# Patient Record
Sex: Male | Born: 1950 | Race: Black or African American | Hispanic: No | Marital: Married | Smoking: Current every day smoker
Health system: Southern US, Community
[De-identification: ages and names within clinical notes are randomized; demographics above are authoritative.]

## PROBLEM LIST (undated history)

## (undated) DIAGNOSIS — C159 Malignant neoplasm of esophagus, unspecified: Secondary | ICD-10-CM

## (undated) DIAGNOSIS — K219 Gastro-esophageal reflux disease without esophagitis: Secondary | ICD-10-CM

## (undated) DIAGNOSIS — E119 Type 2 diabetes mellitus without complications: Secondary | ICD-10-CM

## (undated) DIAGNOSIS — I10 Essential (primary) hypertension: Secondary | ICD-10-CM

## (undated) DIAGNOSIS — N400 Enlarged prostate without lower urinary tract symptoms: Secondary | ICD-10-CM

## (undated) DIAGNOSIS — E039 Hypothyroidism, unspecified: Secondary | ICD-10-CM

## (undated) DIAGNOSIS — C169 Malignant neoplasm of stomach, unspecified: Secondary | ICD-10-CM

## (undated) DIAGNOSIS — K635 Polyp of colon: Secondary | ICD-10-CM

## (undated) DIAGNOSIS — F17200 Nicotine dependence, unspecified, uncomplicated: Secondary | ICD-10-CM

## (undated) DIAGNOSIS — E785 Hyperlipidemia, unspecified: Secondary | ICD-10-CM

## (undated) DIAGNOSIS — B353 Tinea pedis: Secondary | ICD-10-CM

## (undated) HISTORY — DX: Malignant neoplasm of esophagus, unspecified: C15.9

## (undated) HISTORY — DX: Essential (primary) hypertension: I10

## (undated) HISTORY — DX: Hyperlipidemia, unspecified: E78.5

## (undated) HISTORY — DX: Type 2 diabetes mellitus without complications: E11.9

## (undated) HISTORY — DX: Tinea pedis: B35.3

## (undated) HISTORY — PX: TONSILLECTOMY: SUR1361

## (undated) HISTORY — DX: Gastro-esophageal reflux disease without esophagitis: K21.9

## (undated) HISTORY — PX: UPPER GI ENDOSCOPY: SHX6162

## (undated) HISTORY — DX: Nicotine dependence, unspecified, uncomplicated: F17.200

## (undated) HISTORY — PX: COLONOSCOPY: SHX174

## (undated) HISTORY — DX: Hypothyroidism, unspecified: E03.9

## (undated) HISTORY — DX: Polyp of colon: K63.5

## (undated) HISTORY — DX: Benign prostatic hyperplasia without lower urinary tract symptoms: N40.0

---

## 2004-03-13 DIAGNOSIS — M51379 Other intervertebral disc degeneration, lumbosacral region without mention of lumbar back pain or lower extremity pain: Secondary | ICD-10-CM | POA: Insufficient documentation

## 2004-03-13 DIAGNOSIS — M5137 Other intervertebral disc degeneration, lumbosacral region: Secondary | ICD-10-CM | POA: Insufficient documentation

## 2017-06-13 DIAGNOSIS — C159 Malignant neoplasm of esophagus, unspecified: Secondary | ICD-10-CM | POA: Insufficient documentation

## 2017-06-13 HISTORY — DX: Malignant neoplasm of esophagus, unspecified: C15.9

## 2017-07-05 DIAGNOSIS — C169 Malignant neoplasm of stomach, unspecified: Secondary | ICD-10-CM | POA: Insufficient documentation

## 2017-11-27 ENCOUNTER — Encounter: Payer: Self-pay | Admitting: Cardiothoracic Surgery

## 2017-11-28 ENCOUNTER — Encounter: Payer: Self-pay | Admitting: *Deleted

## 2017-11-28 ENCOUNTER — Encounter: Payer: Self-pay | Admitting: Cardiothoracic Surgery

## 2017-11-28 ENCOUNTER — Other Ambulatory Visit: Payer: Self-pay

## 2017-11-28 NOTE — Progress Notes (Signed)
Patient no show for appointment 11/28/17, called patient to r/s, patient stated  That he would call after his appointment with [facility] on 7/24. If he needed to r/s

## 2017-11-28 NOTE — Progress Notes (Deleted)
Nathan Massey       Nathan Massey             [phone]                    Nathan Massey [place] Medical Record #[id number] Date of Birth: [birth year]  Referring: [place] Primary [facility]: No primary care provider on file. Primary Cardiologist: No primary care provider on file.  Chief Complaint:   No chief complaint on file.   History of Present Illness:    Nathan Massey 67 y.o. male is seen in the office  today for       Current Activity/ Functional Status:  {functional status:19517}   Zubrod Score: At the time of surgery this patient's most appropriate activity status/level should be described as: []     0    Normal activity, no symptoms []     1    Restricted in physical strenuous activity but ambulatory, able to do out light work []     2    Ambulatory and capable of self care, unable to do work activities, up and about               >50 % of waking hours                              []     3    Only limited self care, in bed greater than 50% of waking hours []     4    Completely disabled, no self care, confined to bed or chair []     5    Moribund   Past Medical History:  Diagnosis Date  . BPH (benign prostatic hyperplasia)       . Colonic polyp   . Diabetes mellitus ([place])   . 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 GERD (gastroesophageal reflux disease)   . Hyperlipemia   . Hypertension   . Hypothyroidism   . Nicotine dependence   . Tinea pedis     *** The histories are not reviewed yet. Please review them in the "History" navigator section and refresh this [place].  Family History  Problem Relation Age of Onset  . Throat cancer Father      Social History   Tobacco Use  Smoking Status Current Every Day Smoker    Social History   Substance and Sexual Activity  Alcohol Use Yes     Allergies not on file  No current outpatient medications on file.   No current facility-administered medications for this visit.      {Ros - complete:30496}   Review of Systems:     Cardiac Review of Systems: [Y] = yes  or   [ N ] = no   Chest Pain [    ]  Resting SOB [   ] Exertional SOB  [  ]  Orthopnea [  ]   Pedal Edema [   ]    Palpitations [  ] Syncope  [  ]   Presyncope [   ]   General Review of Systems: [Y] = yes [  ]=no Constitional: recent weight change [  ];  Wt loss over the last 3 months [   ] anorexia [  ]; fatigue [  ]; nausea [  ]; night sweats [  ]; fever [  ]; or chills [  ];  Eye : blurred vision [  ]; diplopia [   ]; vision changes [  ];  Amaurosis fugax[  ]; Resp: cough [  ];  wheezing[  ];  hemoptysis[  ]; shortness of breath[  ]; paroxysmal nocturnal dyspnea[  ]; dyspnea on exertion[  ]; or orthopnea[  ];  GI:  gallstones[  ], vomiting[  ];  dysphagia[  ]; melena[  ];  hematochezia [  ]; heartburn[  ];   Hx of  Colonoscopy[  ]; GU: kidney stones [  ]; hematuria[  ];   dysuria [  ];  nocturia[  ];  history of     obstruction [  ]; urinary frequency [  ]             Skin: rash, swelling[  ];, hair loss[  ];  peripheral edema[  ];  or itching[  ]; Musculosketetal: myalgias[  ];  joint swelling[  ];  joint erythema[  ];  joint pain[  ];  back pain[  ];  Heme/Lymph: bruising[  ];  bleeding[  ];  anemia[  ];  Neuro: TIA[  ];  headaches[  ];  stroke[  ];  vertigo[  ];  seizures[  ];   paresthesias[  ];  difficulty walking[  ];  Psych:depression[  ]; anxiety[  ];  Endocrine: diabetes[  ];  thyroid dysfunction[  ];  Immunizations: Flu up to date [  ]; Pneumococcal up to date [  ];  Other:    PHYSICAL EXAMINATION: There were no vitals taken for this visit. {physical exam:21449}  Diagnostic Studies & Laboratory data:     Recent Radiology Findings:   No results found.   I have independently reviewed the above radiology studies  and reviewed the findings with the patient.   Recent Lab Findings: No results found for: WBC, HGB, HCT, PLT, GLUCOSE, CHOL, TRIG, HDL, LDLDIRECT, LDLCALC, ALT,  AST, NA, K, CL, CREATININE, BUN, CO2, TSH, INR, GLUF, HGBA1C    Assessment / Plan:        I  spent {CHL ONC TIME VISIT - IOXBD:5329924268} with  the patient face to face and greater then 50% of the time was spent in counseling and coordination of care.    Grace Isaac MD      [address] [place] [address] Office [phone]   Beeper [phone]  11/28/2017 9:35 AM

## 2018-01-08 ENCOUNTER — Encounter: Payer: Self-pay | Admitting: Cardiothoracic Surgery

## 2018-01-08 NOTE — Progress Notes (Deleted)
[address]       [place] [address]             [phone]                    Akhil Lee Ordaz [place] Medical Record #[id number] Date of Birth: Sep 18, 1950  Referring: [place] Primary Care: No primary care provider on file. Primary Cardiologist: No primary care provider on file.  Chief Complaint:   No chief complaint on file.   History of Present Illness:    Nathan Massey 67 y.o. male is seen in the office  today for       Current Activity/ Functional Status:  {functional status:19517}   Zubrod Score: At the time of surgery this patient's most appropriate activity status/level should be described as: []     0    Normal activity, no symptoms []     1    Restricted in physical strenuous activity but ambulatory, able to do out light work []     2    Ambulatory and capable of self care, unable to do work activities, up and about               >50 % of waking hours                              []     3    Only limited self care, in bed greater than 50% of waking hours []     4    Completely disabled, no self care, confined to bed or chair []     5    Moribund   Past Medical History:  Diagnosis Date  . BPH (benign prostatic hyperplasia)       . Colonic polyp   . Diabetes mellitus ([place])   . 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 GERD (gastroesophageal reflux disease)   . Hyperlipemia   . Hypertension   . Hypothyroidism   . Nicotine dependence   . Tinea pedis     *** The histories are not reviewed yet. Please review them in the "History" navigator section and refresh this [place].  Family History  Problem Relation Age of Onset  . Throat cancer Father      Social History   Tobacco Use  Smoking Status Current Every Day Smoker    Social History   Substance and Sexual Activity  Alcohol Use Yes     Allergies no known allergies  Current Outpatient Medications  Medication Sig Dispense Refill  . oxyCODONE-acetaminophen  (PERCOCET/ROXICET) 5-325 MG tablet Take 1 tablet by mouth every 4 (four) hours as needed. for pain  0  . prochlorperazine (COMPAZINE) 10 MG tablet   1   No current facility-administered medications for this visit.     {Ros - complete:30496}   Review of Systems:     Cardiac Review of Systems: [Y] = yes  or   [ N ] = no   Chest Pain [    ]  Resting SOB [   ] Exertional SOB  [  ]  Orthopnea [  ]   Pedal Edema [   ]    Palpitations [  ] Syncope  [  ]   Presyncope [   ]   General Review of Systems: [Y] = yes [  ]=no Constitional: recent weight change [  ];  Wt loss over the  last 3 months [   ] anorexia [  ]; fatigue [  ]; nausea [  ]; night sweats [  ]; fever [  ]; or chills [  ];           Eye : blurred vision [  ]; diplopia [   ]; vision changes [  ];  Amaurosis fugax[  ]; Resp: cough [  ];  wheezing[  ];  hemoptysis[  ]; shortness of breath[  ]; paroxysmal nocturnal dyspnea[  ]; dyspnea on exertion[  ]; or orthopnea[  ];  GI:  gallstones[  ], vomiting[  ];  dysphagia[  ]; melena[  ];  hematochezia [  ]; heartburn[  ];   Hx of  Colonoscopy[  ]; GU: kidney stones [  ]; hematuria[  ];   dysuria [  ];  nocturia[  ];  history of     obstruction [  ]; urinary frequency [  ]             Skin: rash, swelling[  ];, hair loss[  ];  peripheral edema[  ];  or itching[  ]; Musculosketetal: myalgias[  ];  joint swelling[  ];  joint erythema[  ];  joint pain[  ];  back pain[  ];  Heme/Lymph: bruising[  ];  bleeding[  ];  anemia[  ];  Neuro: TIA[  ];  headaches[  ];  stroke[  ];  vertigo[  ];  seizures[  ];   paresthesias[  ];  difficulty walking[  ];  Psych:depression[  ]; anxiety[  ];  Endocrine: diabetes[  ];  thyroid dysfunction[  ];  Immunizations: Flu up to date [  ]; Pneumococcal up to date [  ];  Other: {Dental Care:21289::"Single injection performed as below:"} {Flue/Pneumonia Vaccination Status:21291}     PHYSICAL EXAMINATION: There were no vitals taken for this visit. {physical  exam:21449}  Diagnostic Studies & Laboratory data:     Recent Radiology Findings:   No results found.   I have independently reviewed the above radiology studies  and reviewed the findings with the patient.   Recent Lab Findings: No results found for: WBC, HGB, HCT, PLT, GLUCOSE, CHOL, TRIG, HDL, LDLDIRECT, LDLCALC, ALT, AST, NA, K, CL, CREATININE, BUN, CO2, TSH, INR, GLUF, HGBA1C    Assessment / Plan:        I  spent {CHL ONC TIME VISIT - PXTGG:[id number]} with  the patient face to face and greater then 50% of the time was spent in counseling and coordination of care.    Grace Isaac MD      [address] [place] [address] Office [phone]   Beeper [phone]  01/08/2018 2:08 PM           [address]       [place] [address]             [phone]                    Aurthur Lee Hazen [place] Medical Record #[id number] Date of Birth: Aug 07, 1950  Referring: [place] Primary Care: No primary care provider on file. Primary Cardiologist: No primary care provider on file.  Chief Complaint:   No chief complaint on file.   History of Present Illness:    Nathan Massey 67 y.o. male is seen in the office  today for       Current Activity/ Functional Status:  {functional status:19517}  Zubrod Score: At the time of surgery this patient's most appropriate activity status/level should be described as: []     0    Normal activity, no symptoms []     1    Restricted in physical strenuous activity but ambulatory, able to do out light work []     2    Ambulatory and capable of self care, unable to do work activities, up and about               >50 % of waking hours                              []     3    Only limited self care, in bed greater than 50% of waking hours []     4    Completely disabled, no self care, confined to bed or chair []     5    Moribund   Past Medical History:  Diagnosis Date  .  BPH (benign prostatic hyperplasia)       . Colonic polyp   . Diabetes mellitus ([place])   . 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 GERD (gastroesophageal reflux disease)   . Hyperlipemia   . Hypertension   . Hypothyroidism   . Nicotine dependence   . Tinea pedis     *** The histories are not reviewed yet. Please review them in the "History" navigator section and refresh this [place].  Family History  Problem Relation Age of Onset  . Throat cancer Father      Social History   Tobacco Use  Smoking Status Current Every Day Smoker    Social History   Substance and Sexual Activity  Alcohol Use Yes     Allergies no known allergies  Current Outpatient Medications  Medication Sig Dispense Refill  . oxyCODONE-acetaminophen (PERCOCET/ROXICET) 5-325 MG tablet Take 1 tablet by mouth every 4 (four) hours as needed. for pain  0  . prochlorperazine (COMPAZINE) 10 MG tablet   1   No current facility-administered medications for this visit.     {Ros - complete:30496}   Review of Systems:     Cardiac Review of Systems: [Y] = yes  or   [ N ] = no   Chest Pain [    ]  Resting SOB [   ] Exertional SOB  [  ]  Orthopnea [  ]   Pedal Edema [   ]    Palpitations [  ] Syncope  [  ]   Presyncope [   ]   General Review of Systems: [Y] = yes [  ]=no Constitional: recent weight change [  ];  Wt loss over the last 3 months [   ] anorexia [  ]; fatigue [  ]; nausea [  ]; night sweats [  ]; fever [  ]; or chills [  ];           Eye : blurred vision [  ]; diplopia [   ]; vision changes [  ];  Amaurosis fugax[  ]; Resp: cough [  ];  wheezing[  ];  hemoptysis[  ]; shortness of breath[  ]; paroxysmal nocturnal dyspnea[  ]; dyspnea on exertion[  ]; or orthopnea[  ];  GI:  gallstones[  ], vomiting[  ];  dysphagia[  ]; melena[  ];  hematochezia [  ]; heartburn[  ];   Hx of  Colonoscopy[  ]; GU:  kidney stones [  ]; hematuria[  ];   dysuria [  ];  nocturia[  ];  history of     obstruction [  ]; urinary frequency [   ]             Skin: rash, swelling[  ];, hair loss[  ];  peripheral edema[  ];  or itching[  ]; Musculosketetal: myalgias[  ];  joint swelling[  ];  joint erythema[  ];  joint pain[  ];  back pain[  ];  Heme/Lymph: bruising[  ];  bleeding[  ];  anemia[  ];  Neuro: TIA[  ];  headaches[  ];  stroke[  ];  vertigo[  ];  seizures[  ];   paresthesias[  ];  difficulty walking[  ];  Psych:depression[  ]; anxiety[  ];  Endocrine: diabetes[  ];  thyroid dysfunction[  ];  Immunizations: Flu up to date [  ]; Pneumococcal up to date [  ];  Other:     PHYSICAL EXAMINATION: There were no vitals taken for this visit. {physical exam:21449}  Diagnostic Studies & Laboratory data:     Recent Radiology Findings:   No results found.   I have independently reviewed the above radiology studies  and reviewed the findings with the patient.   Recent Lab Findings: No results found for: WBC, HGB, HCT, PLT, GLUCOSE, CHOL, TRIG, HDL, LDLDIRECT, LDLCALC, ALT, AST, NA, K, CL, CREATININE, BUN, CO2, TSH, INR, GLUF, HGBA1C    Assessment / Plan:        I  spent {CHL ONC TIME VISIT - OIZTI:[id number]} with  the patient face to face and greater then 50% of the time was spent in counseling and coordination of care.    Grace Isaac MD      [address] ,[place] [address] Office [phone]   Beeper [phone]  01/08/2018 2:08 PM

## 2018-01-09 ENCOUNTER — Telehealth: Payer: Self-pay | Admitting: *Deleted

## 2018-01-30 ENCOUNTER — Encounter: Payer: Self-pay | Admitting: Cardiothoracic Surgery

## 2018-02-12 ENCOUNTER — Encounter: Payer: Self-pay | Admitting: *Deleted

## 2018-02-12 DIAGNOSIS — E119 Type 2 diabetes mellitus without complications: Secondary | ICD-10-CM | POA: Insufficient documentation

## 2018-02-12 DIAGNOSIS — I10 Essential (primary) hypertension: Secondary | ICD-10-CM | POA: Insufficient documentation

## 2018-02-12 DIAGNOSIS — C158 Malignant neoplasm of overlapping sites of esophagus: Secondary | ICD-10-CM

## 2018-02-13 ENCOUNTER — Encounter: Payer: Self-pay | Admitting: Cardiothoracic Surgery

## 2018-02-14 ENCOUNTER — Institutional Professional Consult (permissible substitution) (INDEPENDENT_AMBULATORY_CARE_PROVIDER_SITE_OTHER): Payer: Medicare Other | Admitting: Cardiothoracic Surgery

## 2018-02-14 VITALS — BP 124/86 | HR 58 | Resp 20 | Ht 68.0 in | Wt 120.2 lb

## 2018-02-14 DIAGNOSIS — C160 Malignant neoplasm of cardia: Secondary | ICD-10-CM | POA: Diagnosis not present

## 2018-02-14 NOTE — Progress Notes (Signed)
[address]       [place] [address]             [phone]                    Ajeet Lee Speas [place] Medical Record #[id number] Date of Birth: [birth year]  Referring: [facility] Primary Care: Patient, No Pcp Per Primary Cardiologist: No primary care provider on file.  Chief Complaint:    Chief Complaint  Patient presents with  . Esophageal Cancer    Surgical eval    History of Present Illness:    Nathan Massey 67 y.o. male is seen in the office referred by the [facility] for gastric carcinoma.  The patient originally presented in early 2019 to the [place]. he noted difficulty swallowing.  In addition he had weight loss and intermittent epigastric pain.  A EGD was done at the [facility] June 13, 2017, we have no images of this report notes a large friable mass extending from the GE junction to the fundus of the stomach 42 to 51 cm.  Per the reports from the [place] Mrs. a moderately differentiated adenocarcinoma.  The patient then had a EUS performed in [place] at [facility].  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  The patient started on a series of treatments with weekly carboplatin and Taxol and radiation.  He started radiation therapy August 14, 2017 and completed it  He completed radiation chemotherapy in May.  Has now referred for consideration of resection.  I tried to contact the [facility] system and his oncologist would been caring for him is no longer working there.  Dr. Randell Patient was able to give me some of the information in his care.  The patient now notes that he is gaining some of his weight back initially had lost 20 to 25 pounds.  He is taking a p.o. diet without difficulty.  Denies any abdominal pain.  Denies regurgitation.  The PET scan on the disc was sent with patient has been loaded into the [facility] PACS system, the scan was done in August. -Compared to the scan done in March, there is no hypermetabolic activity in the  stomach area.  Current Activity/ Functional Status:  Patient is independent with mobility/ambulation, transfers, ADL's, IADL's.   Zubrod Score: At the time of surgery this patient's most appropriate activity status/level should be described as: [x]     0    Normal activity, no symptoms []     1    Restricted in physical strenuous activity but ambulatory, able to do out light work []     2    Ambulatory and capable of self care, unable to do work activities, up and about               >50 % of waking hours                              []     3    Only limited self care, in bed greater than 50% of waking hours []     4    Completely disabled, no self care, confined to bed or chair []     5    Moribund   Past Medical History:  Diagnosis Date  . BPH (benign prostatic hyperplasia)       . Colonic polyp   . Diabetes mellitus (  [place])   . 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 GERD (gastroesophageal reflux disease)   . Hyperlipemia   . Hypertension   . Hypothyroidism   . Nicotine dependence   . Tinea pedis     Past Surgical History:  Procedure Laterality Date  . COLONOSCOPY    . TONSILLECTOMY    . UPPER GI ENDOSCOPY     06/13/2017    Family History  Problem Relation Age of Onset  . Throat cancer Father      Social History   Tobacco Use  Smoking Status Current Every Day Smoker  . Packs/day: 1.50  . Years: 50.00  . Pack years: 75.00    Social History   Substance and Sexual Activity  Alcohol Use Yes   Comment: HARD LIQUOR X 50 YRS, NOW BEER     No Known Allergies  Current Outpatient Medications  Medication Sig Dispense Refill  . aspirin EC 81 MG tablet Take 81 mg by mouth daily.    Marland Kitchen atorvastatin (LIPITOR) 10 MG tablet Take 10 mg by mouth at bedtime.    . bisacodyl (BISACODYL) 5 MG EC tablet Take 5 mg by mouth daily as needed for moderate constipation.    . Cholecalciferol (VITAMIN D3) 10000 units TABS Take 1 tablet by mouth  daily.    Marland Kitchen ENSURE PLUS (ENSURE PLUS) LIQD Take 237 mLs by mouth 3 (three) times daily between meals.    Marland Kitchen glucose blood test strip 1 each by Other route once a week. PRECISION XCEED PRO    . levothyroxine (SYNTHROID, LEVOTHROID) 25 MCG tablet Take 25 mcg by mouth daily before breakfast.    . lisinopril (PRINIVIL,ZESTRIL) 10 MG tablet Take 5 mg by mouth daily.    . metFORMIN (GLUCOPHAGE) 500 MG tablet Take 500 mg by mouth at bedtime.    . mirtazapine (REMERON) 30 MG tablet Take 15 mg by mouth at bedtime.    Marland Kitchen omeprazole (PRILOSEC) 20 MG capsule Take 20 mg by mouth 2 (two) times daily before a meal. TAKE 30-60 MINUTES BEFORE FIRST AND LAST MEAL OF THE DAY AND 2 HRS APART FROM VITAMINS AND MINERALS    . ondansetron (ZOFRAN) 8 MG tablet Take by mouth every 8 (eight) hours as needed for nausea or vomiting.    Marland Kitchen oxyCODONE-acetaminophen (PERCOCET/ROXICET) 5-325 MG tablet Take 1 tablet by mouth every 4 (four) hours as needed. for pain  0  . polyethylene glycol (MIRALAX / GLYCOLAX) packet Take 17 g by mouth daily.    . prochlorperazine (COMPAZINE) 10 MG tablet   1  . tamsulosin (FLOMAX) 0.4 MG CAPS capsule Take by mouth.    . terazosin (HYTRIN) 2 MG capsule Take 2 mg by mouth at bedtime.    . terbinafine (LAMISIL) 1 % cream Apply 1 application topically 2 (two) times daily.    . varenicline (CHANTIX) 1 MG tablet Take 1 mg by mouth 2 (two) times daily.     No current facility-administered medications for this visit.     Pertinent items are noted in HPI.   Review of Systems:     Cardiac Review of Systems: [Y] = yes  or   [ N ] = no   Chest Pain [ n   ]  Resting SOB [ n  ] Exertional SOB  [ y ]  Orthopnea [  n]   Pedal Edema [n   ]    Palpitations [n  ] Syncope  [  n]   Presyncope [  n ]   General Review of Systems: [Y] = yes [  ]=no Constitional: recent weight change [ y ];  Wt loss over the last 3 months [place].Lighter   ] anorexia [place]ese  ]; fatigue [ y ]; nausea [  ]; night sweats [  ]; fever [  ]; or chills  [  ];           Eye : blurred vision [  ]; diplopia [   ]; vision changes [  ];  Amaurosis fugax[  ]; Resp: cough [  ];  wheezing[  ];  hemoptysis[  ]; shortness of breath[  ]; paroxysmal nocturnal dyspnea[  ]; dyspnea on exertion[  ]; or orthopnea[  ];  GI:  gallstones[  ], vomiting[  ];  dysphagia[  ]; melena[  ];  hematochezia [  ]; heartburn[  ];   Hx of  Colonoscopy[  ]; GU: kidney stones [  ]; hematuria[  ];   dysuria [  ];  nocturia[  ];  history of     obstruction [  ]; urinary frequency [  ]             Skin: rash, swelling[  ];, hair loss[  ];  peripheral edema[  ];  or itching[  ]; Musculosketetal: myalgias[  ];  joint swelling[  ];  joint erythema[  ];  joint pain[  ];  back pain[  ];  Heme/Lymph: bruising[  ];  bleeding[  ];  anemia[  ];  Neuro: TIA[  ];  headaches[  ];  stroke[  ];  vertigo[  ];  seizures[  ];   paresthesias[  ];  difficulty walking[  ];  Psych:depression[  ]; anxiety[  ];  Endocrine: diabetes[  ];  thyroid dysfunction[  ];  Immunizations: Flu up to date [  ]; Pneumococcal up to date [  ];  Other:     PHYSICAL EXAMINATION: BP 124/86   Pulse (!) 58   Resp 20   Ht 5\' 8"  (1.727 m)   Wt 120 lb 3.2 oz (54.5 kg)   SpO2 98%   BMI 18.28 kg/m  General appearance: alert, cooperative and slowed mentation Head: Normocephalic, without obvious abnormality, atraumatic Neck: no adenopathy, no carotid bruit, no JVD, supple, symmetrical, trachea midline and thyroid not enlarged, symmetric, no tenderness/mass/nodules Lymph nodes: Cervical, supraclavicular, and axillary nodes normal. Resp: clear to auscultation bilaterally Back: symmetric, no curvature. ROM normal. No CVA tenderness. Cardio: regular rate and rhythm, S1, S2 normal, no murmur, click, rub or gallop GI: soft, non-tender; bowel sounds normal; no masses,  no organomegaly Extremities: extremities normal, atraumatic, no cyanosis or edema Neurologic: Grossly normal  Diagnostic Studies & Laboratory data:      Recent Radiology Findings:   No results found. Patient's PET scan from the [facility] has been loaded into the PACS system   I have independently reviewed the above radiology studies /PET scan  and reviewed the findings with the patient.   Recent Lab Findings: No results found for: WBC, HGB, HCT, PLT, GLUCOSE, CHOL, TRIG, HDL, LDLDIRECT, LDLCALC, ALT, AST, NA, K, CL, CREATININE, BUN, CO2, TSH, INR, GLUF, HGBA1C    Assessment / Plan:   Patient presents approximately 5 months post treatment for what appears to be a proximal gastric carcinoma GE junction to the fundus, [place] 3.   With the delay between patient's initial diagnosis and radiation chemo we we will proceed with restaging.  We will asked  Dr. [name] to perform upper GI endoscopy with esophageal ultrasound to further evaluate the exact location and current stage.  I discussed the case with Dr. Barry Dienes who will also see the patient in regards to gastrectomy rather than esophagectomy.   We will plan to see him back as soon is repeat endoscopy and EUS is done.  We discussed this with the [facility] system and appropriate referrals have been made.     I  spent 60 minutes with  the patient face to face and greater then 50% of the time was spent in counseling and coordination of care.    Grace Isaac MD      [address] [place] [address] Office [phone]   Beeper [phone]  02/16/2018 8:09 PM

## 2018-02-14 NOTE — H&P (View-Only) (Signed)
[address]       [place] [address]             [phone]                    [place] [facility] Medical Record #[id number] Date of Birth: Jan 06, 1951  Referring: Time Primary Care: Patient, No Pcp Per Primary Cardiologist: No primary care provider on file.  Chief Complaint:    Chief Complaint  Patient presents with  . Esophageal Cancer    Surgical eval    History of Present Illness:    Nathan Massey 67 y.o. male is seen in the office referred by the [facility] for gastric carcinoma.  The patient originally presented in early 2019 to the [place]. he noted difficulty swallowing.  In addition he had weight loss and intermittent epigastric pain.  A EGD was done at the [facility] June 13, 2017, we have no images of this report notes a large friable mass extending from the GE junction to the fundus of the stomach 42 to 51 cm.  Per the reports from the [place] Mrs. a moderately differentiated adenocarcinoma.  The patient then had a EUS performed in [place] at [facility].  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  The patient started on a series of treatments with weekly carboplatin and Taxol and radiation.  He started radiation therapy August 14, 2017 and completed it  He completed radiation chemotherapy in May.  Has now referred for consideration of resection.  I tried to contact the [place] system and his oncologist would been caring for him is no longer working there.  Dr. Randell Patient was able to give me some of the information in his care.  The patient now notes that he is gaining some of his weight back initially had lost 20 to 25 pounds.  He is taking a p.o. diet without difficulty.  Denies any abdominal pain.  Denies regurgitation.  The PET scan on the disc was sent with patient has been loaded into the [facility] PACS system, the scan was done in August. -Compared to the scan done in March, there is no hypermetabolic activity in the  stomach area.  Current Activity/ Functional Status:  Patient is independent with mobility/ambulation, transfers, ADL's, IADL's.   Zubrod Score: At the time of surgery this patient's most appropriate activity status/level should be described as: [x]     0    Normal activity, no symptoms []     1    Restricted in physical strenuous activity but ambulatory, able to do out light work []     2    Ambulatory and capable of self care, unable to do work activities, up and about               >50 % of waking hours                              []     3    Only limited self care, in bed greater than 50% of waking hours []     4    Completely disabled, no self care, confined to bed or chair []     5    Moribund   Past Medical History:  Diagnosis Date  . BPH (benign prostatic hyperplasia)       . Colonic polyp   . Diabetes mellitus (  [place])   . 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 GERD (gastroesophageal reflux disease)   . Hyperlipemia   . Hypertension   . Hypothyroidism   . Nicotine dependence   . Tinea pedis     Past Surgical History:  Procedure Laterality Date  . COLONOSCOPY    . TONSILLECTOMY    . UPPER GI ENDOSCOPY     06/13/2017    Family History  Problem Relation Age of Onset  . Throat cancer Father      Social History   Tobacco Use  Smoking Status Current Every Day Smoker  . Packs/day: 1.50  . Years: 50.00  . Pack years: 75.00    Social History   Substance and Sexual Activity  Alcohol Use Yes   Comment: HARD LIQUOR X 50 YRS, NOW BEER     No Known Allergies  Current Outpatient Medications  Medication Sig Dispense Refill  . aspirin EC 81 MG tablet Take 81 mg by mouth daily.    Marland Kitchen atorvastatin (LIPITOR) 10 MG tablet Take 10 mg by mouth at bedtime.    . bisacodyl (BISACODYL) 5 MG EC tablet Take 5 mg by mouth daily as needed for moderate constipation.    . Cholecalciferol (VITAMIN D3) 10000 units TABS Take 1 tablet by mouth  daily.    Marland Kitchen ENSURE PLUS (ENSURE PLUS) LIQD Take 237 mLs by mouth 3 (three) times daily between meals.    Marland Kitchen glucose blood test strip 1 each by Other route once a week. PRECISION XCEED PRO    . levothyroxine (SYNTHROID, LEVOTHROID) 25 MCG tablet Take 25 mcg by mouth daily before breakfast.    . lisinopril (PRINIVIL,ZESTRIL) 10 MG tablet Take 5 mg by mouth daily.    . metFORMIN (GLUCOPHAGE) 500 MG tablet Take 500 mg by mouth at bedtime.    . mirtazapine (REMERON) 30 MG tablet Take 15 mg by mouth at bedtime.    Marland Kitchen omeprazole (PRILOSEC) 20 MG capsule Take 20 mg by mouth 2 (two) times daily before a meal. TAKE 30-60 MINUTES BEFORE FIRST AND LAST MEAL OF THE DAY AND 2 HRS APART FROM VITAMINS AND MINERALS    . ondansetron (ZOFRAN) 8 MG tablet Take by mouth every 8 (eight) hours as needed for nausea or vomiting.    Marland Kitchen oxyCODONE-acetaminophen (PERCOCET/ROXICET) 5-325 MG tablet Take 1 tablet by mouth every 4 (four) hours as needed. for pain  0  . polyethylene glycol (MIRALAX / GLYCOLAX) packet Take 17 g by mouth daily.    . prochlorperazine (COMPAZINE) 10 MG tablet   1  . tamsulosin (FLOMAX) 0.4 MG CAPS capsule Take by mouth.    . terazosin (HYTRIN) 2 MG capsule Take 2 mg by mouth at bedtime.    . terbinafine (LAMISIL) 1 % cream Apply 1 application topically 2 (two) times daily.    . varenicline (CHANTIX) 1 MG tablet Take 1 mg by mouth 2 (two) times daily.     No current facility-administered medications for this visit.     Pertinent items are noted in HPI.   Review of Systems:     Cardiac Review of Systems: [Y] = yes  or   [ N ] = no   Chest Pain [ n   ]  Resting SOB [ n  ] Exertional SOB  [ y ]  Orthopnea [  n]   Pedal Edema [n   ]    Palpitations [n  ] Syncope  [  n]   Presyncope [  n ]   General Review of Systems: [Y] = yes [  ]=no Constitional: recent weight change [ y ];  Wt loss over the last 3 months Basilie.Lighter   ] anorexia Blue.Reese  ]; fatigue [ y ]; nausea [  ]; night sweats [  ]; fever [  ]; or chills  [  ];           Eye : blurred vision [  ]; diplopia [   ]; vision changes [  ];  Amaurosis fugax[  ]; Resp: cough [  ];  wheezing[  ];  hemoptysis[  ]; shortness of breath[  ]; paroxysmal nocturnal dyspnea[  ]; dyspnea on exertion[  ]; or orthopnea[  ];  GI:  gallstones[  ], vomiting[  ];  dysphagia[  ]; melena[  ];  hematochezia [  ]; heartburn[  ];   Hx of  Colonoscopy[  ]; GU: kidney stones [  ]; hematuria[  ];   dysuria [  ];  nocturia[  ];  history of     obstruction [  ]; urinary frequency [  ]             Skin: rash, swelling[  ];, hair loss[  ];  peripheral edema[  ];  or itching[  ]; Musculosketetal: myalgias[  ];  joint swelling[  ];  joint erythema[  ];  joint pain[  ];  back pain[  ];  Heme/Lymph: bruising[  ];  bleeding[  ];  anemia[  ];  Neuro: TIA[  ];  headaches[  ];  stroke[  ];  vertigo[  ];  seizures[  ];   paresthesias[  ];  difficulty walking[  ];  Psych:depression[  ]; anxiety[  ];  Endocrine: diabetes[  ];  thyroid dysfunction[  ];  Immunizations: Flu up to date [  ]; Pneumococcal up to date [  ];  Other:     PHYSICAL EXAMINATION: BP 124/86   Pulse (!) 58   Resp 20   Ht 5\' 8"  (1.727 m)   Wt 120 lb 3.2 oz (54.5 kg)   SpO2 98%   BMI 18.28 kg/m  General appearance: alert, cooperative and slowed mentation Head: Normocephalic, without obvious abnormality, atraumatic Neck: no adenopathy, no carotid bruit, no JVD, supple, symmetrical, trachea midline and thyroid not enlarged, symmetric, no tenderness/mass/nodules Lymph nodes: Cervical, supraclavicular, and axillary nodes normal. Resp: clear to auscultation bilaterally Back: symmetric, no curvature. ROM normal. No CVA tenderness. Cardio: regular rate and rhythm, S1, S2 normal, no murmur, click, rub or gallop GI: soft, non-tender; bowel sounds normal; no masses,  no organomegaly Extremities: extremities normal, atraumatic, no cyanosis or edema Neurologic: Grossly normal  Diagnostic Studies & Laboratory data:      Recent Radiology Findings:   No results found. Patient's PET scan from the [place] has been loaded into the PACS system   I have independently reviewed the above radiology studies /PET scan  and reviewed the findings with the patient.   Recent Lab Findings: No results found for: WBC, HGB, HCT, PLT, GLUCOSE, CHOL, TRIG, HDL, LDLDIRECT, LDLCALC, ALT, AST, NA, K, CL, CREATININE, BUN, CO2, TSH, INR, GLUF, HGBA1C    Assessment / Plan:   Patient presents approximately 5 months post treatment for what appears to be a proximal gastric carcinoma GE junction to the fundus, Siewert 3.   With the delay between patient's initial diagnosis and radiation chemo we we will proceed with restaging.  We will asked  Dr. [name] to perform upper GI endoscopy with esophageal ultrasound to further evaluate the exact location and current stage.  I discussed the case with Dr. Barry Dienes who will also see the patient in regards to gastrectomy rather than esophagectomy.   We will plan to see him back as soon is repeat endoscopy and EUS is done.  We discussed this with the [place] system and appropriate referrals have been made.     I  spent 60 minutes with  the patient face to face and greater then 50% of the time was spent in counseling and coordination of care.    Grace Isaac MD      [address] ,[place] [address] Office [phone]   Beeper [phone]  02/16/2018 8:09 PM

## 2018-02-16 ENCOUNTER — Encounter: Payer: Self-pay | Admitting: Cardiothoracic Surgery

## 2018-02-18 ENCOUNTER — Telehealth: Payer: Self-pay | Admitting: Gastroenterology

## 2018-02-18 NOTE — Telephone Encounter (Signed)
I cut/pasted his EGD/EUS report below. To be clear, they felt this was a gastric cancer (not bridging the GE junction).  Neoadjuvant treatment, especially radiation, can lead to lower sensitivity, specificity of endoscopic ultrasound.  Particularly radiation can cause thickening of bowel layers that are indistinguishable from cancer.  With that caveat in mind we are happy to repeat endoscopy with endoscopic ultrasound to examine his tumor now.  Patty, He needs endoscopic ultrasound, next available appointment for proximal gastric adenocarcinoma soonest available appointment with Dr. Rush Landmark or myself, MAC sedation.  thanks      ----------------------------------------- 06/2017 EGD Findings:  The scope was advanced under direct visualization to the esophagogastric junction at 40 cm. The endoscope was then advanced into the stomach, the pylorus was identified and intubated, the duodenum from the third portion of the bulb was inspected.  Findings: On withdrawal of the endoscope following anatomical locations were examined with findings as noted:  Duodenum- normal mucosa Duodenal bulb-normal mucosa Antrum-normal mucosa Stomach body, fundus and cardia- antegrade and retrograde examination was performed. There was an ulcerative friable gastric mass extending from cardia to lesser curvature of stomach almost up to incisura. Mass almost starts from GEJ at 40 cm and extends to lesser curvature up to 52 cm. Mass involves 50% circumference of cardia of stomach. Distal esophagus-normal mucosa Esophageal body-normal mucosa Proximal esophagus-normal mucosa  EUS Findings:  The radial echoendoscope was advanced into the esophagus and the aorta at the level of the GE junction was identified. Celiac axis was identified and no lymph node around celiac axis seen Visualized left lobe of liver appears normal without any liver mass Gastric mass invades muscularis propria and penetrates subserosal  connective tissue without apparent invasion of adjacent structures at least uT3. There were two hypoechoic small peri gastric lymph nodes measuring approximately 6. 3 mm in diameter located near proximal stomach   The echoendoscope was then completely withdrawn, insufflated air was aspirated, and the patient was allowed to recover. No immediate post procedure complications were observed.  Impression:  Gastric adenocarcinoma at least uT3uN1

## 2018-02-18 NOTE — Telephone Encounter (Signed)
Received referral for patient to be seen for an EGD. Spoke to patient who has had an egd this year on 2.22.19- records in [place]. Patient states Dr.Gerhardt wants him checked again, but patient wants all doctors in [place], so he would like to switch to this office. Patient not requesting certain MD so DOD for afternoon of 10.4.19 (referral date) is Dr.Danis. Records in Epic, please review an advise on scheduling.

## 2018-02-18 NOTE — Telephone Encounter (Signed)
EGJ adenocarcinoma Dx earlier this year.  I reviewed the EGD/EUS note from outside hospital in Feb 2019 and 02/14/18 CT surgery consult note from Dr. Servando Snare, who wants this patient to have a restaging EGD and EUS in the [place] system.   This patient needs to see either Dr. Rush Landmark or [name], since he seems to need an EUS.  Dr. Jerilynn Mages is probably available sooner.  DJ and GM,   Please weigh in on how you would like to handle.  - H. Danis

## 2018-02-19 ENCOUNTER — Other Ambulatory Visit: Payer: Self-pay

## 2018-02-19 DIAGNOSIS — C169 Malignant neoplasm of stomach, unspecified: Secondary | ICD-10-CM

## 2018-02-19 NOTE — Telephone Encounter (Signed)
EUS 03/06/18 [facility] Montes 1030 am EUS scheduled, pt instructed and medications reviewed.  Patient instructions mailed to home.  Patient to call with any questions or concerns.

## 2018-02-20 ENCOUNTER — Encounter: Payer: Self-pay | Admitting: Cardiothoracic Surgery

## 2018-03-06 ENCOUNTER — Ambulatory Visit (HOSPITAL_COMMUNITY): Payer: No Typology Code available for payment source | Admitting: Anesthesiology

## 2018-03-06 ENCOUNTER — Encounter (HOSPITAL_COMMUNITY): Payer: Self-pay | Admitting: Anesthesiology

## 2018-03-06 ENCOUNTER — Other Ambulatory Visit: Payer: Self-pay

## 2018-03-06 ENCOUNTER — Ambulatory Visit (HOSPITAL_COMMUNITY)
Admission: RE | Admit: 2018-03-06 | Discharge: 2018-03-06 | Disposition: A | Discharge status: Home / Self Care | Payer: No Typology Code available for payment source | Source: Ambulatory Visit | Attending: Gastroenterology | Admitting: Gastroenterology

## 2018-03-06 ENCOUNTER — Encounter (HOSPITAL_COMMUNITY)
Admission: RE | Disposition: A | Discharge status: Home / Self Care | Payer: Self-pay | Source: Ambulatory Visit | Attending: Gastroenterology

## 2018-03-06 DIAGNOSIS — Z923 Personal history of irradiation: Secondary | ICD-10-CM | POA: Insufficient documentation

## 2018-03-06 DIAGNOSIS — F17210 Nicotine dependence, cigarettes, uncomplicated: Secondary | ICD-10-CM | POA: Diagnosis not present

## 2018-03-06 DIAGNOSIS — Z7984 Long term (current) use of oral hypoglycemic drugs: Secondary | ICD-10-CM | POA: Diagnosis not present

## 2018-03-06 DIAGNOSIS — E039 Hypothyroidism, unspecified: Secondary | ICD-10-CM | POA: Diagnosis not present

## 2018-03-06 DIAGNOSIS — K219 Gastro-esophageal reflux disease without esophagitis: Secondary | ICD-10-CM | POA: Insufficient documentation

## 2018-03-06 DIAGNOSIS — Z9221 Personal history of antineoplastic chemotherapy: Secondary | ICD-10-CM | POA: Insufficient documentation

## 2018-03-06 DIAGNOSIS — E119 Type 2 diabetes mellitus without complications: Secondary | ICD-10-CM | POA: Insufficient documentation

## 2018-03-06 DIAGNOSIS — Z800 Family history of malignant neoplasm of digestive organs: Secondary | ICD-10-CM | POA: Diagnosis not present

## 2018-03-06 DIAGNOSIS — C169 Malignant neoplasm of stomach, unspecified: Secondary | ICD-10-CM | POA: Insufficient documentation

## 2018-03-06 DIAGNOSIS — E785 Hyperlipidemia, unspecified: Secondary | ICD-10-CM | POA: Diagnosis not present

## 2018-03-06 DIAGNOSIS — N400 Enlarged prostate without lower urinary tract symptoms: Secondary | ICD-10-CM | POA: Insufficient documentation

## 2018-03-06 DIAGNOSIS — Z7982 Long term (current) use of aspirin: Secondary | ICD-10-CM | POA: Insufficient documentation

## 2018-03-06 DIAGNOSIS — I10 Essential (primary) hypertension: Secondary | ICD-10-CM | POA: Diagnosis not present

## 2018-03-06 DIAGNOSIS — Z79899 Other long term (current) drug therapy: Secondary | ICD-10-CM | POA: Insufficient documentation

## 2018-03-06 DIAGNOSIS — Z8501 Personal history of malignant neoplasm of esophagus: Secondary | ICD-10-CM | POA: Diagnosis not present

## 2018-03-06 HISTORY — PX: BIOPSY: SHX5522

## 2018-03-06 HISTORY — PX: EUS: SHX5427

## 2018-03-06 LAB — GLUCOSE, CAPILLARY: GLUCOSE-CAPILLARY: 71 mg/dL (ref 70–99)

## 2018-03-06 SURGERY — UPPER ENDOSCOPIC ULTRASOUND (EUS) RADIAL
Anesthesia: Monitor Anesthesia Care

## 2018-03-06 MED ORDER — PROPOFOL 500 MG/50ML IV EMUL
INTRAVENOUS | Status: DC | PRN
  Administered 2018-03-06: 100 ug/kg/min via INTRAVENOUS

## 2018-03-06 MED ORDER — PROPOFOL 10 MG/ML IV BOLUS
INTRAVENOUS | Status: AC
  Filled 2018-03-06: qty 40

## 2018-03-06 MED ORDER — PROPOFOL 500 MG/50ML IV EMUL
INTRAVENOUS | Status: DC | PRN
  Administered 2018-03-06: 30 mg via INTRAVENOUS

## 2018-03-06 MED ORDER — SODIUM CHLORIDE 0.9 % IV SOLN: INTRAVENOUS | Status: DC

## 2018-03-06 MED ORDER — ONDANSETRON HCL 4 MG/2ML IJ SOLN
INTRAMUSCULAR | Status: DC | PRN
  Administered 2018-03-06: 4 mg via INTRAVENOUS

## 2018-03-06 MED ORDER — LACTATED RINGERS IV SOLN: INTRAVENOUS | Status: DC

## 2018-03-06 NOTE — Discharge Instructions (Signed)
Esophagogastroduodenoscopy, Care After °Refer to this sheet in the next few weeks. These instructions provide you with information about caring for yourself after your procedure. Your health care provider may also give you more specific instructions. Your treatment has been planned according to current medical practices, but problems sometimes occur. Call your health care provider if you have any problems or questions after your procedure. °What can I expect after the procedure? °After the procedure, it is common to have: °· A sore throat. °· Nausea. °· Bloating. °· Dizziness. °· Fatigue. ° °Follow these instructions at home: °· Do not eat or drink anything until the numbing medicine (local anesthetic) has worn off and your gag reflex has returned. You will know that the local anesthetic has worn off when you can swallow comfortably. °· Do not drive for 24 hours if you received a medicine to help you relax (sedative). °· If your health care provider took a tissue sample for testing during the procedure, make sure to get your test results. This is your responsibility. Ask your health care provider or the department performing the test when your results will be ready. °· Keep all follow-up visits as told by your health care provider. This is important. °Contact a health care provider if: °· You cannot stop coughing. °· You are not urinating. °· You are urinating less than usual. °Get help right away if: °· You have trouble swallowing. °· You cannot eat or drink. °· You have throat or chest pain that gets worse. °· You are dizzy or light-headed. °· You faint. °· You have nausea or vomiting. °· You have chills. °· You have a fever. °· You have severe abdominal pain. °· You have black, tarry, or bloody stools. °This information is not intended to replace advice given to you by your health care provider. Make sure you discuss any questions you have with your health care provider. °Document Released: 04/16/2012 Document  Revised: 10/06/2015 Document Reviewed: 03/24/2015 °Elsevier Interactive Patient Education © 2018 Elsevier Inc. ° °

## 2018-03-06 NOTE — Op Note (Signed)
[facility] Patient Name: Nathan Massey Procedure Date: 03/06/2018 MRN: [id number] Attending MD: Milus Banister , MD Date of Birth: 1950-11-09 CSN: [id number] Age: 67 Admit Type: Outpatient Procedure:                Upper EUS Indications:              Diagnosed with proximal gastric adenocarcinoma in                            [facility] around 06/2017; underwent chemo/XRT,                            last treatments were probably several months ago;                            here for 'restaging' Providers:                Milus Banister, MD, Cleda Daub, RN, Cletis Athens, Technician, Arnoldo Hooker, CRNA Referring MD:             Sharia Reeve, MD Medicines:                Monitored Anesthesia Care Complications:            No immediate complications. Estimated blood loss:                            None. Estimated Blood Loss:     Estimated blood loss: none. Procedure:                Pre-Anesthesia Assessment:                           - Prior to the procedure, a History and Physical                            was performed, and patient medications and                            allergies were reviewed. The patient's tolerance of                            previous anesthesia was also reviewed. The risks                            and benefits of the procedure and the sedation                            options and risks were discussed with the patient.                            All questions were answered, and informed consent  was obtained. Prior Anticoagulants: The patient has                            taken no previous anticoagulant or antiplatelet                            agents. ASA Grade Assessment: IV - A patient with                            severe systemic disease that is a constant threat                            to life. After reviewing the risks and benefits,                            the patient  was deemed in satisfactory condition to                            undergo the procedure.                           After obtaining informed consent, the endoscope was                            passed under direct vision. Throughout the                            procedure, the patient's blood pressure, pulse, and                            oxygen saturations were monitored continuously. The                            [id number] ([id number]) Olympus Radial EUS was                            introduced through the mouth, and advanced to the                            body of the stomach. The upper EUS was accomplished                            without difficulty. The patient tolerated the                            procedure well. Scope In: Scope Out: Findings:      ENDOSCOPIC FINDING: :      1. There was a large, very friable mass from the GE junction to about       mid gastric body. This was so friable and bloody that it was difficult       to visualize very well but it is clearly NOT circumferential (probably       occupies 2/3 of the lumen circumference), the most proximal aspect of  the tumor is just at the GE junction and it is about 7-8cm in       cranial-caudal length. I sampled the mass with biopsy.      2. The most distal 1/3 to 1/2 of the stomach is normal appearing.      ENDOSONOGRAPHIC FINDING: :      1. The mass above correlates with an irregular very thickened gastric       wall. Since he has had radiation, some of the findings may be from       radiation related effect. If all of the findings are from the cancer he       is at least T3.      2. There were scattered, supicious appearing perigastric lymphnodes       mearuring 6-5mm across.      3. Limited views of the liver, pancreas were normal. Impression:               Large, friable proximal gastric mass with most                            proximal aspect located at the GE junction. This is                             non-circumferential, probably 7-8cm in length and                            by EUS imaging is uT3N1. Note that radiation                            related effect from his neoadjuvant treatment can                            lead to false overstaging of tumors. The mass was                            biopsied with forceps. Moderate Sedation:      N/A- Per Anesthesia Care Recommendation:           - Discharge patient to home (ambulatory).                           - I think restaging cross section imaging would be                            helpful before final treatment decisions since his                            last imaging that I can find (PET) is 2 1/2 months                            old. Procedure Code(s):        --- Professional ---                           [phone], 52, Esophagogastroduodenoscopy, flexible,  transoral; with endoscopic ultrasound examination                            limited to the esophagus, stomach or duodenum, and                            adjacent structures Diagnosis Code(s):        --- Professional ---                           C16.9, Malignant neoplasm of stomach, unspecified                           Z08, Encounter for follow-up examination after                            completed treatment for malignant neoplasm CPT copyright 2018 American Medical Association. All rights reserved. The codes documented in this report are preliminary and upon coder review may  be revised to meet current compliance requirements. Milus Banister, MD 03/06/2018 10:54:12 AM This report has been signed electronically. Number of Addenda: 0

## 2018-03-06 NOTE — Anesthesia Preprocedure Evaluation (Signed)
Anesthesia Evaluation  Patient identified by MRN, date of birth, ID band Patient awake    Reviewed: Allergy & Precautions, NPO status , Patient's Chart, lab work & pertinent test results  Airway Mallampati: II  TM Distance: >3 FB Neck ROM: Full    Dental  (+) Edentulous Upper, Edentulous Lower   Pulmonary Current Smoker,    breath sounds clear to auscultation       Cardiovascular hypertension,  Rhythm:Regular Rate:Normal     Neuro/Psych    GI/Hepatic   Endo/Other  diabetes  Renal/GU      Musculoskeletal   Abdominal   Peds  Hematology   Anesthesia Other Findings   Reproductive/Obstetrics                             Anesthesia Physical Anesthesia Plan  ASA: III  Anesthesia Plan: MAC   Post-op Pain Management:    Induction: Intravenous  PONV Risk Score and Plan: Ondansetron and Propofol infusion  Airway Management Planned: Natural Airway and Nasal Cannula  Additional Equipment:   Intra-op Plan:   Post-operative Plan:   Informed Consent: I have reviewed the patients History and Physical, chart, labs and discussed the procedure including the risks, benefits and alternatives for the proposed anesthesia with the patient or authorized representative who has indicated his/her understanding and acceptance.       Plan Discussed with: CRNA and Anesthesiologist  Anesthesia Plan Comments:         Anesthesia Quick Evaluation  

## 2018-03-06 NOTE — Anesthesia Postprocedure Evaluation (Signed)
Anesthesia Post Note  Patient: Nathan Massey  Procedure(s) Performed: UPPER ENDOSCOPIC ULTRASOUND (EUS) RADIAL (N/A ) BIOPSY     Patient location during evaluation: PACU Anesthesia Type: MAC Level of consciousness: awake and alert Pain management: pain level controlled Vital Signs Assessment: post-procedure vital signs reviewed and stable Respiratory status: spontaneous breathing, nonlabored ventilation, respiratory function stable and patient connected to nasal cannula oxygen Cardiovascular status: stable and blood pressure returned to baseline Postop Assessment: no apparent nausea or vomiting Anesthetic complications: no    Last Vitals:  Vitals:   03/06/18 1100 03/06/18 1110  BP: (!) 151/69 (!) 141/82  Pulse: (!) 48 (!) 44  Resp: (!) 23 19  Temp:    SpO2: 100% 100%    Last Pain:  Vitals:   03/06/18 1110  TempSrc:   PainSc: 0-No pain                 Zorina Mallin COKER

## 2018-03-06 NOTE — Interval H&P Note (Signed)
History and Physical Interval Note:  03/06/2018 9:52 AM  Nathan Massey  has presented today for surgery, with the diagnosis of gastric adenocarcinoma  The various methods of treatment have been discussed with the patient and family. After consideration of risks, benefits and other options for treatment, the patient has consented to  Procedure(s): UPPER ENDOSCOPIC ULTRASOUND (EUS) RADIAL (N/A) as a surgical intervention .  The patient's history has been reviewed, patient examined, no change in status, stable for surgery.  I have reviewed the patient's chart and labs.  Questions were answered to the patient's satisfaction.     Milus Banister

## 2018-03-06 NOTE — Transfer of Care (Signed)
Immediate Anesthesia Transfer of Care Note  Patient: Ezrael Sam  Procedure(s) Performed: Procedure(s): UPPER ENDOSCOPIC ULTRASOUND (EUS) RADIAL (N/A) BIOPSY  Patient Location: PACU  Anesthesia Type:MAC  Level of Consciousness:  sedated, patient cooperative and responds to stimulation  Airway & Oxygen Therapy:Patient Spontanous Breathing and Patient connected to face mask oxgen  Post-op Assessment:  Report given to PACU RN and Post -op Vital signs reviewed and stable  Post vital signs:  Reviewed and stable  Last Vitals:  Vitals:   03/06/18 0947 03/06/18 1049  BP: (!) 145/92 114/70  Pulse: (!) 51 (!) 57  Resp: 19 (!) 21  Temp: 36.4 C   SpO2: 89% 37%    Complications: No apparent anesthesia complications

## 2018-03-10 ENCOUNTER — Telehealth: Payer: Self-pay

## 2018-03-10 NOTE — Telephone Encounter (Signed)
LMOM to call Dr Everrett Coombe office to schedule appt

## 2018-03-21 ENCOUNTER — Other Ambulatory Visit: Payer: Self-pay | Admitting: *Deleted

## 2018-03-21 ENCOUNTER — Other Ambulatory Visit: Payer: Self-pay | Admitting: General Surgery

## 2018-03-21 DIAGNOSIS — C169 Malignant neoplasm of stomach, unspecified: Secondary | ICD-10-CM

## 2018-03-27 ENCOUNTER — Other Ambulatory Visit: Payer: Self-pay | Admitting: General Surgery

## 2018-04-08 ENCOUNTER — Other Ambulatory Visit: Payer: Self-pay | Admitting: General Surgery

## 2018-04-15 NOTE — Pre-Procedure Instructions (Signed)
Griffyn Kucinski  04/15/2018     No Pharmacies Listed   Your procedure is scheduled on Tues., Dec. 11, 2019 from 7:30AM-2:28PM  Report to [facility] Admitting Entrance "A" at 5:30AM  Call this number if you have problems the morning of surgery:  [phone]   Remember:  Do not eat after midnight on Dec. 9th  You may drink clear liquids until 3 hours (4:30AM) prior to surgery.  Clear liquids allowed are:   Water, Juice (non-citric and without pulp), Carbonated beverages, Clear Tea, Black Coffee only, Plain Jell-O only, Gatorade and Plain Popsicles only    Take these medicines the morning of surgery with A SIP OF WATER: NONE  As of today, stop taking all Other Aspirin Products, Vitamins, Fish oils, and Herbal medications. Also stop all NSAIDS i.e. Advil, Ibuprofen, Motrin, Aleve, Anaprox, Naproxen, BC, Goody Powders, and all Supplements.  WHAT DO I DO ABOUT MY DIABETES MEDICATION?  Marland Kitchen Do not take MetFORMIN (GLUCOPHAGE-XR) the morning of surgery.  How to Manage Your Diabetes Before and After Surgery  Why is it important to control my blood sugar before and after surgery? . Improving blood sugar levels before and after surgery helps healing and can limit problems. . A way of improving blood sugar control is eating a healthy diet by: o  Eating less sugar and carbohydrates o  Increasing activity/exercise o  Talking with your doctor about reaching your blood sugar goals . High blood sugars (greater than 180 mg/dL) can raise your risk of infections and slow your recovery, so you will need to focus on controlling your diabetes during the weeks before surgery. . Make sure that the doctor who takes care of your diabetes knows about your planned surgery including the date and location.  How do I manage my blood sugar before surgery? . Check your blood sugar at least 4 times a day, starting 2 days before surgery, to make sure that the level is not too high or low. o Check your  blood sugar the morning of your surgery when you wake up and every 2 hours until you get to the Short Stay unit. . If your blood sugar is less than 70 mg/dL, you will need to treat for low blood sugar: o Do not take insulin. o Treat a low blood sugar (less than 70 mg/dL) with  cup of clear juice (cranberry or apple), 4 glucose tablets, OR glucose gel. Recheck blood sugar in 15 minutes after treatment (to make sure it is greater than 70 mg/dL). If your blood sugar is not greater than 70 mg/dL on recheck, call [phone] o  for further instructions. . If your CBG is greater than 220 mg/dL, inform the staff upon arrival to Short Stay  . If you are admitted to the hospital after surgery: o Your blood sugar will be checked by the staff and you will probably be given insulin after surgery (instead of oral diabetes medicines) to make sure you have good blood sugar levels. o The goal for blood sugar control after surgery is 80-180 mg/dL.  Reviewed and Endorsed by [facility] Patient Education Committee, August 2015   Do not wear jewelry.  Do not wear lotions, powders, colognes, or deodorant.  Do not shave 48 hours prior to surgery.  Men may shave face.  Do not bring valuables to the hospital.  [facility] is not responsible for any belongings or valuables.  Contacts, dentures or bridgework may not be worn into surgery.  Leave your suitcase in the car.  After surgery it may be brought to your room.  For patients admitted to the hospital, discharge time will be determined by your treatment team.  Patients discharged the day of surgery will not be allowed to drive home.   Special instructions:  [place]- Preparing For Surgery  Before surgery, you can play an important role. Because skin is not sterile, your skin needs to be as free of germs as possible. You can reduce the number of germs on your skin by washing with CHG (chlorahexidine gluconate) Soap before surgery.  CHG is an antiseptic  cleaner which kills germs and bonds with the skin to continue killing germs even after washing.    Oral Hygiene is also important to reduce your risk of infection.  Remember - BRUSH YOUR TEETH THE MORNING OF SURGERY WITH YOUR REGULAR TOOTHPASTE  Please do not use if you have an allergy to CHG or antibacterial soaps. If your skin becomes reddened/irritated stop using the CHG.  Do not shave (including legs and underarms) for at least 48 hours prior to first CHG shower. It is OK to shave your face.  Please follow these instructions carefully.   1. Shower the NIGHT BEFORE SURGERY and the MORNING OF SURGERY with CHG.   2. If you chose to wash your hair, wash your hair first as usual with your normal shampoo.  3. After you shampoo, rinse your hair and body thoroughly to remove the shampoo.  4. Use CHG as you would any other liquid soap. You can apply CHG directly to the skin and wash gently with a scrungie or a clean washcloth.   5. Apply the CHG Soap to your body ONLY FROM THE NECK DOWN.  Do not use on open wounds or open sores. Avoid contact with your eyes, ears, mouth and genitals (private parts). Wash Face and genitals (private parts)  with your normal soap.  6. Wash thoroughly, paying special attention to the area where your surgery will be performed.  7. Thoroughly rinse your body with warm water from the neck down.  8. DO NOT shower/wash with your normal soap after using and rinsing off the CHG Soap.  9. Pat yourself dry with a CLEAN TOWEL.  10. Wear CLEAN PAJAMAS to bed the night before surgery, wear comfortable clothes the morning of surgery  11. Place CLEAN SHEETS on your bed the night of your first shower and DO NOT SLEEP WITH PETS.  Day of Surgery:  Do not apply any deodorants/lotions.  Please wear clean clothes to the hospital/surgery center.   Remember to brush your teeth WITH YOUR REGULAR TOOTHPASTE.  Please read over the following fact sheets that you were given. Pain  Booklet, Coughing and Deep Breathing and Surgical Site Infection Prevention

## 2018-04-16 ENCOUNTER — Encounter (HOSPITAL_COMMUNITY)
Admission: RE | Admit: 2018-04-16 | Discharge: 2018-04-16 | Disposition: A | Discharge status: Home / Self Care | Payer: No Typology Code available for payment source | Source: Ambulatory Visit | Attending: General Surgery | Admitting: General Surgery

## 2018-04-16 ENCOUNTER — Other Ambulatory Visit: Payer: Self-pay

## 2018-04-16 ENCOUNTER — Encounter (HOSPITAL_COMMUNITY): Payer: Self-pay

## 2018-04-16 DIAGNOSIS — I10 Essential (primary) hypertension: Secondary | ICD-10-CM | POA: Diagnosis not present

## 2018-04-16 DIAGNOSIS — Z01818 Encounter for other preprocedural examination: Secondary | ICD-10-CM | POA: Insufficient documentation

## 2018-04-16 DIAGNOSIS — R9431 Abnormal electrocardiogram [ECG] [EKG]: Secondary | ICD-10-CM | POA: Diagnosis not present

## 2018-04-16 LAB — CBC WITH DIFFERENTIAL/PLATELET
Abs Immature Granulocytes: 0.01 10*3/uL (ref 0.00–0.07)
Basophils Absolute: 0 10*3/uL (ref 0.0–0.1)
Basophils Relative: 1 %
Eosinophils Absolute: 0.1 10*3/uL (ref 0.0–0.5)
Eosinophils Relative: 1 %
HCT: 39.6 % (ref 39.0–52.0)
Hemoglobin: 13.2 g/dL (ref 13.0–17.0)
Immature Granulocytes: 0 %
Lymphocytes Relative: 23 %
Lymphs Abs: 1 10*3/uL (ref 0.7–4.0)
MCH: 32.1 pg (ref 26.0–34.0)
MCHC: 33.3 g/dL (ref 30.0–36.0)
MCV: 96.4 fL (ref 80.0–100.0)
Monocytes Absolute: 0.4 10*3/uL (ref 0.1–1.0)
Monocytes Relative: 9 %
Neutro Abs: 2.8 10*3/uL (ref 1.7–7.7)
Neutrophils Relative %: 66 %
PLATELETS: 184 10*3/uL (ref 150–400)
RBC: 4.11 MIL/uL — ABNORMAL LOW (ref 4.22–5.81)
RDW: 14 % (ref 11.5–15.5)
WBC: 4.3 10*3/uL (ref 4.0–10.5)
nRBC: 0 % (ref 0.0–0.2)

## 2018-04-16 LAB — COMPREHENSIVE METABOLIC PANEL
ALT: 17 U/L (ref 0–44)
AST: 19 U/L (ref 15–41)
Albumin: 3.9 g/dL (ref 3.5–5.0)
Alkaline Phosphatase: 61 U/L (ref 38–126)
Anion gap: 8 (ref 5–15)
BUN: 14 mg/dL (ref 8–23)
CHLORIDE: 102 mmol/L (ref 98–111)
CO2: 27 mmol/L (ref 22–32)
Calcium: 9.6 mg/dL (ref 8.9–10.3)
Creatinine, Ser: 0.82 mg/dL (ref 0.61–1.24)
GFR calc Af Amer: >60 mL/min (ref >60)
Glucose, Bld: 125 mg/dL — ABNORMAL HIGH (ref 70–99)
Potassium: 4.4 mmol/L (ref 3.5–5.1)
Sodium: 137 mmol/L (ref 135–145)
Total Bilirubin: 0.7 mg/dL (ref 0.3–1.2)
Total Protein: 6.8 g/dL (ref 6.5–8.1)

## 2018-04-16 LAB — URINALYSIS, ROUTINE W REFLEX MICROSCOPIC
Bilirubin Urine: NEGATIVE
Glucose, UA: NEGATIVE mg/dL
Hgb urine dipstick: NEGATIVE
Ketones, ur: NEGATIVE mg/dL
Leukocytes, UA: NEGATIVE
Nitrite: NEGATIVE
Protein, ur: NEGATIVE mg/dL
Specific Gravity, Urine: 1.023 (ref 1.005–1.030)
pH: 6 (ref 5.0–8.0)

## 2018-04-16 LAB — HEMOGLOBIN A1C
Hgb A1c MFr Bld: 6.5 % — ABNORMAL HIGH (ref 4.8–5.6)
Mean Plasma Glucose: 139.85 mg/dL

## 2018-04-16 LAB — PREPARE RBC (CROSSMATCH)

## 2018-04-16 LAB — ABO/RH: ABO/RH(D): O POS

## 2018-04-16 LAB — PROTIME-INR
INR: 1
Prothrombin Time: 13.1 seconds (ref 11.4–15.2)

## 2018-04-16 LAB — SURGICAL PCR SCREEN
MRSA, PCR: NEGATIVE
Staphylococcus aureus: NEGATIVE

## 2018-04-16 LAB — GLUCOSE, CAPILLARY: GLUCOSE-CAPILLARY: 132 mg/dL — AB (ref 70–99)

## 2018-04-16 NOTE — Progress Notes (Signed)
PCP - patient has no established PCP, states he goes to "a place in [place] when I am sick."  Patient states he gets his prescription drugs from the [place]. Cardiologist - patient denies  Chest x-ray - n/a EKG - 04/16/2018 Stress Test - patient denies ECHO - patient denies Cardiac Cath - patient denies  Sleep Study - patient denies  Fasting Blood Sugar - patient does not check his CBG Checks Blood Sugar _____ times a day  Blood Thinner Instructions: Aspirin Instructions: patient instructed to stop 7 days prior to surgery  Anesthesia review: n/a  Patient denies shortness of breath, fever, cough and chest pain at PAT appointment   Patient verbalized understanding of instructions that were given to them at the PAT appointment. Patient was also instructed that they will need to review over the PAT instructions again at home before surgery.

## 2018-04-21 NOTE — H&P (Signed)
Nathan Massey Location: [facility] Patient #: [id number] DOB: 07/03/1950 Married / Language: English / Race: Black or African American Male   History of Present Illness The patient is a 67 year old male who presents with gastric cancer. Patient is a 67 year old male referred by Dr. Servando Massey for diagnosis of adenocarcinoma of the GE junction. The patient presented to the [place] in [place] in February 2019 with dysphasia. Work-up showed a 9 cm mass at the GE junction that was positive for adenocarcinoma. He had an endoscopic ultrasound that showed the tumor to be a uT3N1. He underwent neoadjuvant chemotherapy and partially completed radiation. He was losing too much weight so the radiation portion was stopped. He has not received any chemotherapy for around 5 months. [facility] and for [facility] reasons have [facility]. He presented to Dr. Servando Massey for evaluation for surgical treatment. Because of the time intervening, he had a follow-up CT scanning of the chest abdomen pelvis that was negative for metastatic disease. He also had a repeat endoscopic ultrasound that showed similar findings. Repeat biopsy at that time was still positive for adenocarcinoma. The patient says his weight is relatively stable at this time, but compared to prior to his diagnosis, he has lost around 30 pounds. He is starting to have more difficulty eating although he had significant improvement after chemotherapy.  His father had throat cancer. He is a current smoker.   EUS Nathan Massey 03/06/2018 1. The mass above correlates with an irregular very thickened gastric wall. Since he has had radiation, some of the findings may be from radiation related effect. If all of the findings are from the cancer he is at least T3. 2. There were scattered, supicious appearing perigastric lymphnodes mearuring 6-12mm across. 3. Limited views of the liver, pancreas were normal. Large, friable proximal gastric  mass with most proximal aspect located at the GE junction. This is non-circumferential, probably 7-8cm in length and by EUS imaging is uT3N1. Note that radiation related effect from his neoadjuvant treatment can lead to false overstaging of tumors. The mass was biopsied with forceps.  [facility] chest[facility]abd[facility]pelvis [facility] 02[facility]2018 negative for metastatic dx.  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   Past Surgical History Tonsillectomy   Allergies No Known Drug Allergies   Medication History  Aspirin (81MG  Tablet, Oral) Active. Cholecalciferol (1000UNIT Capsule, Oral) Active. Ensure (Oral) Active. Lisinopril (10MG  Tablet, Oral) Active. metFORMIN HCl (500MG  Tablet, Oral) Active. Terazosin HCl (2MG  Capsule, Oral) Active. Medications Reconciled  Social History  Caffeine use  Tea. Tobacco use  Current every day smoker.  Family History Cancer  Father. Prostate Cancer  Brother.  Other Problems High blood pressure     Review of Systems General Present- Weight Loss. Not Present- Appetite Loss, Chills, Fatigue, Fever, Night Sweats and Weight Gain. Cardiovascular Present- Leg Cramps. Not Present- Chest Pain, Difficulty Breathing Lying Down, Palpitations, Rapid Heart Rate, Shortness of Breath and Swelling of Extremities. Gastrointestinal Not Present- Abdominal Pain, Bloating, Bloody Stool, Change in Bowel Habits, Chronic diarrhea, Constipation, Difficulty Swallowing, Excessive gas, Gets full quickly at meals, Hemorrhoids, Indigestion, Nausea, Rectal Pain and Vomiting. All other systems negative  Vitals  Weight: 123 lb Height: 69in Body Surface Area: 1.68 m Body Mass Index: 18.16 kg/m  Temp.: 28F(Oral)  Pulse: 62 (Regular)  BP: 120/80 (Sitting, Left Arm, Standard)       Physical Exam General Mental Status-Alert. General Appearance-Consistent with  stated  age. Hydration-Well hydrated. Voice-Normal. Note: cachectic.   Head and Neck Head-normocephalic, atraumatic with no lesions or palpable masses. Trachea-midline. Thyroid Gland Characteristics - normal size and consistency.  Eye Eyeball - Bilateral-Extraocular movements intact. Sclera/Conjunctiva - Bilateral-No scleral icterus.  Chest and Lung Exam Chest and lung exam reveals -quiet, even and easy respiratory effort with no use of accessory muscles and on auscultation, normal breath sounds, no adventitious sounds and normal vocal resonance. Inspection Chest Wall - Normal. Back - normal.  Cardiovascular Cardiovascular examination reveals -normal heart sounds, regular rate and rhythm with no murmurs and normal pedal pulses bilaterally.  Abdomen Inspection Inspection of the abdomen reveals - No Hernias. Palpation/Percussion Palpation and Percussion of the abdomen reveal - Soft, Non Tender, No Rebound tenderness, No Rigidity (guarding) and No hepatosplenomegaly. Auscultation Auscultation of the abdomen reveals - Bowel sounds normal.  Neurologic Neurologic evaluation reveals -alert and oriented x 3 with no impairment of recent or remote memory. Mental Status-Normal.  Musculoskeletal Global Assessment -Note: no gross deformities.  Normal Exam - Left-Upper Extremity Strength Normal and Lower Extremity Strength Normal. Normal Exam - Right-Upper Extremity Strength Normal and Lower Extremity Strength Normal.  Lymphatic Head & Neck  General Head & Neck Lymphatics: Bilateral - Description - Normal. Axillary  General Axillary Region: Bilateral - Description - Normal. Tenderness - Non Tender. Femoral & Inguinal  Generalized Femoral & Inguinal Lymphatics: Bilateral - Description - No Generalized lymphadenopathy.    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 I discussed diagnostic laparoscopy to look for carcinomatosis. I advised that if we found carcinomatosis that we would not do a resection of his GE junction cancer. If no carcinomatosis was seen, we would make a upper abdominal incision and take part or all of his stomach out depending on his anatomy and the tumor. I would likely do this with Dr. Servando Massey to make sure that we can get a good esophageal margin. I advised the patient that he will need a feeding tube with the surgery because of the risk of leak and because he is very unlikely to be able to have adequate oral nutrition for several months.  Because of the lapse in care, he would like to see oncology and potentially radiation oncology to get all of his treatment here.  I discussed the risk of becoming very ill and needing to have additional surgery or drains. I discussed risks of blood clot, heart or lung complications, and death. I discussed potential for feeding tube complications. He is eager to go forward with surgery. We will do this at the first available opportunity. Current Plans Pt Education - [facility] You are being scheduled for surgery- Our schedulers will call you.  You should hear from our office's scheduling department within 5 working days about the location, date, and time of surgery. We try to make accommodations for patient's preferences in scheduling surgery, but sometimes the OR schedule or the surgeon's schedule prevents [place] from making those accommodations.  If you have not heard from our office [phone]) in 5 working days, call the office and ask for your surgeon's nurse.

## 2018-04-22 ENCOUNTER — Inpatient Hospital Stay (HOSPITAL_COMMUNITY): Payer: No Typology Code available for payment source | Admitting: Physician Assistant

## 2018-04-22 ENCOUNTER — Inpatient Hospital Stay (HOSPITAL_COMMUNITY)
Admission: RE | Admit: 2018-04-22 | Discharge: 2018-05-08 | DRG: 326 | Disposition: A | Discharge status: Home Health | Payer: No Typology Code available for payment source | Attending: General Surgery | Admitting: General Surgery

## 2018-04-22 ENCOUNTER — Encounter (HOSPITAL_COMMUNITY)
Admission: RE | Disposition: A | Discharge status: Home Health | Payer: Self-pay | Source: Home / Self Care | Attending: General Surgery

## 2018-04-22 ENCOUNTER — Inpatient Hospital Stay (HOSPITAL_COMMUNITY): Payer: No Typology Code available for payment source

## 2018-04-22 ENCOUNTER — Encounter (HOSPITAL_COMMUNITY): Payer: Self-pay | Admitting: *Deleted

## 2018-04-22 DIAGNOSIS — Z9221 Personal history of antineoplastic chemotherapy: Secondary | ICD-10-CM

## 2018-04-22 DIAGNOSIS — Z79899 Other long term (current) drug therapy: Secondary | ICD-10-CM | POA: Diagnosis not present

## 2018-04-22 DIAGNOSIS — Z7982 Long term (current) use of aspirin: Secondary | ICD-10-CM

## 2018-04-22 DIAGNOSIS — Z903 Acquired absence of stomach [part of]: Secondary | ICD-10-CM

## 2018-04-22 DIAGNOSIS — E875 Hyperkalemia: Secondary | ICD-10-CM | POA: Diagnosis present

## 2018-04-22 DIAGNOSIS — K56609 Unspecified intestinal obstruction, unspecified as to partial versus complete obstruction: Secondary | ICD-10-CM

## 2018-04-22 DIAGNOSIS — E039 Hypothyroidism, unspecified: Secondary | ICD-10-CM | POA: Diagnosis present

## 2018-04-22 DIAGNOSIS — Z923 Personal history of irradiation: Secondary | ICD-10-CM | POA: Diagnosis not present

## 2018-04-22 DIAGNOSIS — E43 Unspecified severe protein-calorie malnutrition: Secondary | ICD-10-CM | POA: Diagnosis present

## 2018-04-22 DIAGNOSIS — Z8249 Family history of ischemic heart disease and other diseases of the circulatory system: Secondary | ICD-10-CM

## 2018-04-22 DIAGNOSIS — D62 Acute posthemorrhagic anemia: Secondary | ICD-10-CM | POA: Diagnosis not present

## 2018-04-22 DIAGNOSIS — K219 Gastro-esophageal reflux disease without esophagitis: Secondary | ICD-10-CM | POA: Diagnosis present

## 2018-04-22 DIAGNOSIS — F17210 Nicotine dependence, cigarettes, uncomplicated: Secondary | ICD-10-CM | POA: Diagnosis present

## 2018-04-22 DIAGNOSIS — Z800 Family history of malignant neoplasm of digestive organs: Secondary | ICD-10-CM

## 2018-04-22 DIAGNOSIS — Z8042 Family history of malignant neoplasm of prostate: Secondary | ICD-10-CM

## 2018-04-22 DIAGNOSIS — Z681 Body mass index (BMI) 19 or less, adult: Secondary | ICD-10-CM | POA: Diagnosis not present

## 2018-04-22 DIAGNOSIS — D72829 Elevated white blood cell count, unspecified: Secondary | ICD-10-CM

## 2018-04-22 DIAGNOSIS — R1110 Vomiting, unspecified: Secondary | ICD-10-CM

## 2018-04-22 DIAGNOSIS — C772 Secondary and unspecified malignant neoplasm of intra-abdominal lymph nodes: Secondary | ICD-10-CM | POA: Diagnosis present

## 2018-04-22 DIAGNOSIS — I10 Essential (primary) hypertension: Secondary | ICD-10-CM | POA: Diagnosis present

## 2018-04-22 DIAGNOSIS — C160 Malignant neoplasm of cardia: Principal | ICD-10-CM | POA: Diagnosis present

## 2018-04-22 DIAGNOSIS — D134 Benign neoplasm of liver: Secondary | ICD-10-CM | POA: Diagnosis present

## 2018-04-22 DIAGNOSIS — Z0189 Encounter for other specified special examinations: Secondary | ICD-10-CM

## 2018-04-22 DIAGNOSIS — J189 Pneumonia, unspecified organism: Secondary | ICD-10-CM | POA: Diagnosis not present

## 2018-04-22 DIAGNOSIS — E119 Type 2 diabetes mellitus without complications: Secondary | ICD-10-CM | POA: Diagnosis present

## 2018-04-22 DIAGNOSIS — C169 Malignant neoplasm of stomach, unspecified: Secondary | ICD-10-CM | POA: Diagnosis present

## 2018-04-22 DIAGNOSIS — R188 Other ascites: Secondary | ICD-10-CM | POA: Diagnosis present

## 2018-04-22 DIAGNOSIS — R64 Cachexia: Secondary | ICD-10-CM | POA: Diagnosis present

## 2018-04-22 DIAGNOSIS — Z980 Intestinal bypass and anastomosis status: Secondary | ICD-10-CM

## 2018-04-22 HISTORY — PX: DISTAL PANCREATECTOMY: SHX1468

## 2018-04-22 HISTORY — PX: GASTROSTOMY: SHX5249

## 2018-04-22 HISTORY — PX: SPLENECTOMY, TOTAL: SHX788

## 2018-04-22 HISTORY — PX: LAPAROSCOPY: SHX197

## 2018-04-22 HISTORY — PX: TOTAL GASTRECTOMY: SHX2540

## 2018-04-22 HISTORY — PX: SPLENECTOMY: SUR1306

## 2018-04-22 HISTORY — DX: Malignant neoplasm of stomach, unspecified: C16.9

## 2018-04-22 HISTORY — PX: GASTRECTOMY: SHX58

## 2018-04-22 HISTORY — PX: JEJUNOSTOMY FEEDING TUBE: SUR737

## 2018-04-22 LAB — PREPARE RBC (CROSSMATCH)

## 2018-04-22 LAB — GLUCOSE, CAPILLARY
GLUCOSE-CAPILLARY: 250 mg/dL — AB (ref 70–99)
Glucose-Capillary: 75 mg/dL (ref 70–99)
Glucose-Capillary: 260 mg/dL — ABNORMAL HIGH (ref 70–99)

## 2018-04-22 SURGERY — LAPAROSCOPY, DIAGNOSTIC
Anesthesia: General | Site: Abdomen

## 2018-04-22 MED ORDER — METHOCARBAMOL 1000 MG/10ML IJ SOLN
500.0000 mg | Freq: Three times a day (TID) | INTRAVENOUS | Status: DC | PRN
  Administered 2018-04-29 – 2018-05-01 (×3): 500 mg via INTRAVENOUS
  Filled 2018-04-22: qty 500
  Filled 2018-04-22 (×4): qty 5

## 2018-04-22 MED ORDER — ONDANSETRON HCL 4 MG/2ML IJ SOLN
INTRAMUSCULAR | Status: DC | PRN
  Administered 2018-04-22: 4 mg via INTRAVENOUS

## 2018-04-22 MED ORDER — LIDOCAINE 2% (20 MG/ML) 5 ML SYRINGE
INTRAMUSCULAR | Status: AC
  Filled 2018-04-22: qty 5

## 2018-04-22 MED ORDER — LIDOCAINE 2% (20 MG/ML) 5 ML SYRINGE
INTRAMUSCULAR | Status: DC | PRN
  Administered 2018-04-22: 60 mg via INTRAVENOUS

## 2018-04-22 MED ORDER — STERILE WATER FOR IRRIGATION IR SOLN
Status: DC | PRN
  Administered 2018-04-22: 2000 mL

## 2018-04-22 MED ORDER — BUPIVACAINE 0.25 % ON-Q PUMP DUAL CATH 300 ML
INJECTION | Status: AC | PRN
  Administered 2018-04-22: 300 mL

## 2018-04-22 MED ORDER — PROPOFOL 10 MG/ML IV BOLUS
INTRAVENOUS | Status: AC
  Filled 2018-04-22: qty 20

## 2018-04-22 MED ORDER — ROCURONIUM BROMIDE 10 MG/ML (PF) SYRINGE
PREFILLED_SYRINGE | INTRAVENOUS | Status: DC | PRN
  Administered 2018-04-22 (×3): 10 mg via INTRAVENOUS
  Administered 2018-04-22 (×2): 50 mg via INTRAVENOUS

## 2018-04-22 MED ORDER — DEXAMETHASONE SODIUM PHOSPHATE 10 MG/ML IJ SOLN
INTRAMUSCULAR | Status: DC | PRN
  Administered 2018-04-22: 8 mg via INTRAVENOUS

## 2018-04-22 MED ORDER — ONDANSETRON HCL 4 MG/2ML IJ SOLN
INTRAMUSCULAR | Status: AC
  Filled 2018-04-22: qty 2

## 2018-04-22 MED ORDER — LIDOCAINE HCL 1 % IJ SOLN
INTRAMUSCULAR | Status: DC | PRN
  Administered 2018-04-22: 3 mL

## 2018-04-22 MED ORDER — 0.9 % SODIUM CHLORIDE (POUR BTL) OPTIME
TOPICAL | Status: DC | PRN
  Administered 2018-04-22: 2000 mL

## 2018-04-22 MED ORDER — OXYCODONE HCL 5 MG/5ML PO SOLN: 5.0000 mg | Freq: Once | ORAL | Status: DC | PRN

## 2018-04-22 MED ORDER — FENTANYL CITRATE (PF) 250 MCG/5ML IJ SOLN
INTRAMUSCULAR | Status: DC | PRN
  Administered 2018-04-22 (×2): 50 ug via INTRAVENOUS
  Administered 2018-04-22: 100 ug via INTRAVENOUS
  Administered 2018-04-22: 50 ug via INTRAVENOUS
  Administered 2018-04-22: 100 ug via INTRAVENOUS
  Administered 2018-04-22 (×3): 50 ug via INTRAVENOUS

## 2018-04-22 MED ORDER — SODIUM CHLORIDE 0.9 % IV SOLN
INTRAVENOUS | Status: DC | PRN
  Administered 2018-04-22: 08:00:00 via INTRAVENOUS

## 2018-04-22 MED ORDER — LACTATED RINGERS IV SOLN
INTRAVENOUS | Status: DC | PRN
  Administered 2018-04-22: 08:00:00 via INTRAVENOUS

## 2018-04-22 MED ORDER — MIDAZOLAM HCL 2 MG/2ML IJ SOLN
INTRAMUSCULAR | Status: AC
  Filled 2018-04-22: qty 2

## 2018-04-22 MED ORDER — FENTANYL CITRATE (PF) 100 MCG/2ML IJ SOLN
INTRAMUSCULAR | Status: AC
  Filled 2018-04-22: qty 2

## 2018-04-22 MED ORDER — CHLORHEXIDINE GLUCONATE CLOTH 2 % EX PADS: 6.0000 | MEDICATED_PAD | Freq: Once | CUTANEOUS | Status: DC

## 2018-04-22 MED ORDER — CEFAZOLIN SODIUM-DEXTROSE 2-4 GM/100ML-% IV SOLN
2.0000 g | Freq: Three times a day (TID) | INTRAVENOUS | Status: AC
  Administered 2018-04-22: 2 g via INTRAVENOUS
  Filled 2018-04-22: qty 100

## 2018-04-22 MED ORDER — SODIUM CHLORIDE 0.9% FLUSH: 9.0000 mL | INTRAVENOUS | Status: DC | PRN

## 2018-04-22 MED ORDER — DIPHENHYDRAMINE HCL 12.5 MG/5ML PO ELIX: 12.5000 mg | ORAL_SOLUTION | Freq: Four times a day (QID) | ORAL | Status: DC | PRN

## 2018-04-22 MED ORDER — ONDANSETRON HCL 4 MG/2ML IJ SOLN
4.0000 mg | Freq: Four times a day (QID) | INTRAMUSCULAR | Status: DC | PRN
  Administered 2018-04-26: 4 mg via INTRAVENOUS
  Filled 2018-04-22 (×2): qty 2

## 2018-04-22 MED ORDER — PROCHLORPERAZINE EDISYLATE 10 MG/2ML IJ SOLN
5.0000 mg | Freq: Four times a day (QID) | INTRAMUSCULAR | Status: DC | PRN
  Administered 2018-04-26 – 2018-04-27 (×2): 10 mg via INTRAVENOUS
  Administered 2018-04-29: 5 mg via INTRAVENOUS
  Administered 2018-04-30 – 2018-05-01 (×3): 10 mg via INTRAVENOUS
  Filled 2018-04-22 (×8): qty 2

## 2018-04-22 MED ORDER — SUCCINYLCHOLINE CHLORIDE 200 MG/10ML IV SOSY
PREFILLED_SYRINGE | INTRAVENOUS | Status: AC
  Filled 2018-04-22: qty 10

## 2018-04-22 MED ORDER — PHENYLEPHRINE 40 MCG/ML (10ML) SYRINGE FOR IV PUSH (FOR BLOOD PRESSURE SUPPORT)
PREFILLED_SYRINGE | INTRAVENOUS | Status: AC
  Filled 2018-04-22: qty 10

## 2018-04-22 MED ORDER — BUPIVACAINE 0.25 % ON-Q PUMP DUAL CATH 300 ML
300.0000 mL | INJECTION | Status: DC
  Filled 2018-04-22: qty 300

## 2018-04-22 MED ORDER — HYDRALAZINE HCL 20 MG/ML IJ SOLN
10.0000 mg | INTRAMUSCULAR | Status: DC | PRN
  Administered 2018-04-22: 10 mg via INTRAVENOUS

## 2018-04-22 MED ORDER — HYDRALAZINE HCL 20 MG/ML IJ SOLN
INTRAMUSCULAR | Status: AC
  Filled 2018-04-22: qty 1

## 2018-04-22 MED ORDER — NALOXONE HCL 0.4 MG/ML IJ SOLN: 0.4000 mg | INTRAMUSCULAR | Status: DC | PRN

## 2018-04-22 MED ORDER — LIDOCAINE HCL (PF) 1 % IJ SOLN
INTRAMUSCULAR | Status: AC
  Filled 2018-04-22: qty 30

## 2018-04-22 MED ORDER — GABAPENTIN 300 MG PO CAPS
300.0000 mg | ORAL_CAPSULE | ORAL | Status: AC
  Administered 2018-04-22: 300 mg via ORAL
  Filled 2018-04-22: qty 1

## 2018-04-22 MED ORDER — OXYCODONE HCL 5 MG PO TABS: 5.0000 mg | ORAL_TABLET | Freq: Once | ORAL | Status: DC | PRN

## 2018-04-22 MED ORDER — DIPHENHYDRAMINE HCL 12.5 MG/5ML PO ELIX
12.5000 mg | ORAL_SOLUTION | Freq: Four times a day (QID) | ORAL | Status: DC | PRN
  Filled 2018-04-22: qty 5

## 2018-04-22 MED ORDER — PHENYLEPHRINE 40 MCG/ML (10ML) SYRINGE FOR IV PUSH (FOR BLOOD PRESSURE SUPPORT)
PREFILLED_SYRINGE | INTRAVENOUS | Status: DC | PRN
  Administered 2018-04-22: 80 ug via INTRAVENOUS
  Administered 2018-04-22: 120 ug via INTRAVENOUS
  Administered 2018-04-22: 40 ug via INTRAVENOUS
  Administered 2018-04-22 (×2): 80 ug via INTRAVENOUS

## 2018-04-22 MED ORDER — SUGAMMADEX SODIUM 200 MG/2ML IV SOLN
INTRAVENOUS | Status: DC | PRN
  Administered 2018-04-22: 200 mg via INTRAVENOUS

## 2018-04-22 MED ORDER — ONDANSETRON HCL 4 MG/2ML IJ SOLN: 4.0000 mg | Freq: Once | INTRAMUSCULAR | Status: DC | PRN

## 2018-04-22 MED ORDER — INSULIN ASPART 100 UNIT/ML ~~LOC~~ SOLN
0.0000 [IU] | SUBCUTANEOUS | Status: DC
  Administered 2018-04-22: 5 [IU] via SUBCUTANEOUS
  Administered 2018-04-22: 3 [IU] via SUBCUTANEOUS
  Administered 2018-04-23: 2 [IU] via SUBCUTANEOUS
  Administered 2018-04-23: 1 [IU] via SUBCUTANEOUS
  Administered 2018-04-23: 2 [IU] via SUBCUTANEOUS
  Administered 2018-04-24 – 2018-04-25 (×7): 1 [IU] via SUBCUTANEOUS
  Administered 2018-04-26 (×3): 2 [IU] via SUBCUTANEOUS
  Administered 2018-04-26: 1 [IU] via SUBCUTANEOUS
  Administered 2018-04-26 – 2018-04-27 (×2): 2 [IU] via SUBCUTANEOUS
  Administered 2018-04-27: 1 [IU] via SUBCUTANEOUS
  Administered 2018-04-27 (×3): 2 [IU] via SUBCUTANEOUS
  Administered 2018-04-27 – 2018-04-28 (×4): 1 [IU] via SUBCUTANEOUS
  Administered 2018-04-28 – 2018-04-29 (×3): 2 [IU] via SUBCUTANEOUS
  Administered 2018-04-29 (×2): 1 [IU] via SUBCUTANEOUS
  Administered 2018-04-29 – 2018-05-01 (×10): 2 [IU] via SUBCUTANEOUS
  Administered 2018-05-01 (×3): 3 [IU] via SUBCUTANEOUS
  Administered 2018-05-01 – 2018-05-02 (×2): 2 [IU] via SUBCUTANEOUS
  Administered 2018-05-02: 3 [IU] via SUBCUTANEOUS
  Administered 2018-05-02 (×3): 2 [IU] via SUBCUTANEOUS
  Administered 2018-05-02 – 2018-05-03 (×3): 1 [IU] via SUBCUTANEOUS
  Administered 2018-05-03: 2 [IU] via SUBCUTANEOUS
  Administered 2018-05-03: 1 [IU] via SUBCUTANEOUS
  Administered 2018-05-03: 2 [IU] via SUBCUTANEOUS
  Administered 2018-05-03 – 2018-05-04 (×3): 1 [IU] via SUBCUTANEOUS
  Administered 2018-05-04 (×3): 2 [IU] via SUBCUTANEOUS
  Administered 2018-05-04: 3 [IU] via SUBCUTANEOUS
  Administered 2018-05-05: 2 [IU] via SUBCUTANEOUS
  Administered 2018-05-05: 1 [IU] via SUBCUTANEOUS
  Administered 2018-05-05: 2 [IU] via SUBCUTANEOUS
  Administered 2018-05-05 (×2): 1 [IU] via SUBCUTANEOUS
  Administered 2018-05-05 – 2018-05-06 (×2): 2 [IU] via SUBCUTANEOUS
  Administered 2018-05-06: 3 [IU] via SUBCUTANEOUS
  Administered 2018-05-06: 1 [IU] via SUBCUTANEOUS
  Administered 2018-05-06: 2 [IU] via SUBCUTANEOUS
  Administered 2018-05-06: 1 [IU] via SUBCUTANEOUS
  Administered 2018-05-07: 3 [IU] via SUBCUTANEOUS
  Administered 2018-05-07 (×3): 2 [IU] via SUBCUTANEOUS
  Administered 2018-05-07 – 2018-05-08 (×2): 1 [IU] via SUBCUTANEOUS
  Administered 2018-05-08 (×3): 2 [IU] via SUBCUTANEOUS

## 2018-04-22 MED ORDER — FENTANYL CITRATE (PF) 250 MCG/5ML IJ SOLN
INTRAMUSCULAR | Status: AC
  Filled 2018-04-22: qty 5

## 2018-04-22 MED ORDER — SODIUM CHLORIDE 0.9 % IV SOLN
INTRAVENOUS | Status: DC | PRN
  Administered 2018-04-22: 20 ug/min via INTRAVENOUS

## 2018-04-22 MED ORDER — HYDROMORPHONE 1 MG/ML IV SOLN
INTRAVENOUS | Status: DC
  Administered 2018-04-22: 6 mg via INTRAVENOUS
  Administered 2018-04-22: 25 mg via INTRAVENOUS
  Administered 2018-04-22: 11 mg via INTRAVENOUS
  Administered 2018-04-23: 7 mg via INTRAVENOUS
  Administered 2018-04-23: 14 mg via INTRAVENOUS
  Administered 2018-04-23: 5.4 mg via INTRAVENOUS
  Administered 2018-04-23: 8 mg via INTRAVENOUS
  Administered 2018-04-23: 11 mg via INTRAVENOUS
  Administered 2018-04-24: 0.8 mg via INTRAVENOUS
  Administered 2018-04-24: 1.8 mg via INTRAVENOUS
  Administered 2018-04-25: 25 mg via INTRAVENOUS
  Administered 2018-04-25: 2.6 mg via INTRAVENOUS
  Administered 2018-04-25: 2.8 mg via INTRAVENOUS
  Administered 2018-04-25 (×2): 2.2 mg via INTRAVENOUS
  Administered 2018-04-25: 0.6 mg via INTRAVENOUS
  Administered 2018-04-26: 0.2 mg via INTRAVENOUS
  Administered 2018-04-26: 0 mg via INTRAVENOUS
  Administered 2018-04-26: 0.2 mg via INTRAVENOUS
  Filled 2018-04-22 (×2): qty 25

## 2018-04-22 MED ORDER — BUPIVACAINE-EPINEPHRINE (PF) 0.25% -1:200000 IJ SOLN
INTRAMUSCULAR | Status: AC
  Filled 2018-04-22: qty 30

## 2018-04-22 MED ORDER — ROCURONIUM BROMIDE 50 MG/5ML IV SOSY
PREFILLED_SYRINGE | INTRAVENOUS | Status: AC
  Filled 2018-04-22: qty 5

## 2018-04-22 MED ORDER — ACETAMINOPHEN 10 MG/ML IV SOLN
INTRAVENOUS | Status: AC
  Filled 2018-04-22: qty 100

## 2018-04-22 MED ORDER — FENTANYL CITRATE (PF) 100 MCG/2ML IJ SOLN: 25.0000 ug | INTRAMUSCULAR | Status: DC | PRN

## 2018-04-22 MED ORDER — SODIUM CHLORIDE 0.9% IV SOLUTION: Freq: Once | INTRAVENOUS | Status: DC

## 2018-04-22 MED ORDER — ONDANSETRON HCL 4 MG/2ML IJ SOLN: 4.0000 mg | Freq: Four times a day (QID) | INTRAMUSCULAR | Status: DC | PRN

## 2018-04-22 MED ORDER — ENOXAPARIN SODIUM 40 MG/0.4ML ~~LOC~~ SOLN
40.0000 mg | SUBCUTANEOUS | Status: DC
  Administered 2018-04-23 – 2018-04-26 (×4): 40 mg via SUBCUTANEOUS
  Filled 2018-04-22 (×4): qty 0.4

## 2018-04-22 MED ORDER — EPHEDRINE 5 MG/ML INJ
INTRAVENOUS | Status: AC
  Filled 2018-04-22: qty 10

## 2018-04-22 MED ORDER — EVICEL 5 ML EX KIT
PACK | CUTANEOUS | Status: AC
  Filled 2018-04-22: qty 1

## 2018-04-22 MED ORDER — PROCHLORPERAZINE MALEATE 10 MG PO TABS
10.0000 mg | ORAL_TABLET | Freq: Four times a day (QID) | ORAL | Status: DC | PRN
  Filled 2018-04-22: qty 1

## 2018-04-22 MED ORDER — DIPHENHYDRAMINE HCL 50 MG/ML IJ SOLN: 12.5000 mg | Freq: Four times a day (QID) | INTRAMUSCULAR | Status: DC | PRN

## 2018-04-22 MED ORDER — LACTATED RINGERS IV SOLN
INTRAVENOUS | Status: DC | PRN
  Administered 2018-04-22 (×2): via INTRAVENOUS

## 2018-04-22 MED ORDER — ACETAMINOPHEN 10 MG/ML IV SOLN
1000.0000 mg | Freq: Four times a day (QID) | INTRAVENOUS | Status: AC
  Administered 2018-04-22 – 2018-04-23 (×4): 1000 mg via INTRAVENOUS
  Filled 2018-04-22 (×4): qty 100

## 2018-04-22 MED ORDER — SODIUM CHLORIDE 0.9 % IR SOLN
Status: DC | PRN
  Administered 2018-04-22: 1000 mL

## 2018-04-22 MED ORDER — PROPOFOL 10 MG/ML IV BOLUS
INTRAVENOUS | Status: DC | PRN
  Administered 2018-04-22: 50 mg via INTRAVENOUS

## 2018-04-22 MED ORDER — CEFAZOLIN SODIUM-DEXTROSE 2-4 GM/100ML-% IV SOLN
2.0000 g | INTRAVENOUS | Status: AC
  Administered 2018-04-22 (×2): 2 g via INTRAVENOUS
  Filled 2018-04-22: qty 100

## 2018-04-22 MED ORDER — KCL IN DEXTROSE-NACL 20-5-0.45 MEQ/L-%-% IV SOLN
INTRAVENOUS | Status: DC
  Administered 2018-04-22 – 2018-04-23 (×2): via INTRAVENOUS
  Filled 2018-04-22 (×2): qty 1000

## 2018-04-22 MED ORDER — ALBUMIN HUMAN 5 % IV SOLN
INTRAVENOUS | Status: DC | PRN
  Administered 2018-04-22 (×2): via INTRAVENOUS

## 2018-04-22 MED ORDER — DEXAMETHASONE SODIUM PHOSPHATE 10 MG/ML IJ SOLN
INTRAMUSCULAR | Status: AC
  Filled 2018-04-22: qty 1

## 2018-04-22 MED ORDER — FENTANYL CITRATE (PF) 100 MCG/2ML IJ SOLN
25.0000 ug | INTRAMUSCULAR | Status: DC | PRN
  Administered 2018-04-22 (×3): 50 ug via INTRAVENOUS

## 2018-04-22 MED ORDER — ACETAMINOPHEN 500 MG PO TABS
1000.0000 mg | ORAL_TABLET | ORAL | Status: DC
  Filled 2018-04-22: qty 2

## 2018-04-22 MED ORDER — MIDAZOLAM HCL 5 MG/5ML IJ SOLN
INTRAMUSCULAR | Status: DC | PRN
  Administered 2018-04-22: 2 mg via INTRAVENOUS

## 2018-04-22 SURGICAL SUPPLY — 135 items
BAG BILE T-TUBES STRL (MISCELLANEOUS) ×3 IMPLANT
BIOPATCH RED 1 DISK 7.0 (GAUZE/BANDAGES/DRESSINGS) IMPLANT
BIOPATCH RED 1IN DISK 7.0MM (GAUZE/BANDAGES/DRESSINGS)
BLADE CLIPPER SURG (BLADE) ×3 IMPLANT
BLADE SURG 11 STRL SS (BLADE) IMPLANT
BOOT SUTURE AID YELLOW STND (SUTURE) ×3 IMPLANT
CANISTER SUCT 3000ML PPV (MISCELLANEOUS) ×12 IMPLANT
CATH KIT ON Q 5IN SLV (PAIN MANAGEMENT) IMPLANT
CATH KIT ON-Q SILVERSOAK 7.5 (CATHETERS) IMPLANT
CATH KIT ON-Q SILVERSOAK 7.5IN (CATHETERS) ×15 IMPLANT
CATH ROBINSON RED A/P 22FR (CATHETERS) IMPLANT
CATH THORACIC 28FR (CATHETERS) IMPLANT
CATH THORACIC 36FR (CATHETERS) IMPLANT
CATH THORACIC 36FR RT ANG (CATHETERS) IMPLANT
CHLORAPREP W/TINT 26ML (MISCELLANEOUS) ×5 IMPLANT
CLIP VESOCCLUDE LG 6/CT (CLIP) IMPLANT
CLIP VESOCCLUDE MED 24/CT (CLIP) ×3 IMPLANT
CLIP VESOLOCK LG 6/CT PURPLE (CLIP) IMPLANT
CLIP VESOLOCK MED 6/CT (CLIP) IMPLANT
CLIP VESOLOCK MED LG 6/CT (CLIP) IMPLANT
CONT SPEC 4OZ CLIKSEAL STRL BL (MISCELLANEOUS) ×10 IMPLANT
COVER SURGICAL LIGHT HANDLE (MISCELLANEOUS) ×5 IMPLANT
COVER WAND RF STERILE (DRAPES) ×10 IMPLANT
DECANTER SPIKE VIAL GLASS SM (MISCELLANEOUS) ×10 IMPLANT
DERMABOND ADVANCED (GAUZE/BANDAGES/DRESSINGS)
DERMABOND ADVANCED .7 DNX12 (GAUZE/BANDAGES/DRESSINGS) ×2 IMPLANT
DRAIN CHANNEL 19F RND (DRAIN) ×3 IMPLANT
DRAIN CHANNEL 28F RND 3/8 FF (WOUND CARE) IMPLANT
DRAIN PENROSE 1/2X36 STERILE (WOUND CARE) IMPLANT
DRAIN PENROSE 1/4X12 LTX STRL (WOUND CARE) ×3 IMPLANT
DRAIN PENROSE 18X1/4 LTX STRL (WOUND CARE) ×3 IMPLANT
DRAPE LAPAROSCOPIC ABDOMINAL (DRAPES) ×4 IMPLANT
DRAPE UTILITY XL STRL (DRAPES) IMPLANT
DRAPE WARM FLUID 44X44 (DRAPE) ×7 IMPLANT
DRILL BIT 7/64X5 (BIT) IMPLANT
DRSG COVADERM 4X10 (GAUZE/BANDAGES/DRESSINGS) ×3 IMPLANT
DRSG COVADERM 4X8 (GAUZE/BANDAGES/DRESSINGS) IMPLANT
DRSG TEGADERM 4X4.75 (GAUZE/BANDAGES/DRESSINGS) IMPLANT
DRSG TELFA 3X8 NADH (GAUZE/BANDAGES/DRESSINGS) ×5 IMPLANT
ELECT BLADE 4.0 EZ CLEAN MEGAD (MISCELLANEOUS)
ELECT BLADE 6.5 EXT (BLADE) ×5 IMPLANT
ELECT REM PT RETURN 9FT ADLT (ELECTROSURGICAL) ×10
ELECTRODE BLDE 4.0 EZ CLN MEGD (MISCELLANEOUS) ×2 IMPLANT
ELECTRODE REM PT RTRN 9FT ADLT (ELECTROSURGICAL) ×6 IMPLANT
EVACUATOR SILICONE 100CC (DRAIN) IMPLANT
FILTER SMOKE EVACUATOR (FILTER) ×3 IMPLANT
GAUZE SPONGE 4X4 12PLY STRL (GAUZE/BANDAGES/DRESSINGS) ×7 IMPLANT
GEL PDS (MISCELLANEOUS) ×3 IMPLANT
GLOVE BIO SURGEON STRL SZ 6 (GLOVE) ×5 IMPLANT
GLOVE BIO SURGEON STRL SZ 6.5 (GLOVE) ×8 IMPLANT
GLOVE BIO SURGEONS STRL SZ 6.5 (GLOVE) ×2
GLOVE INDICATOR 6.5 STRL GRN (GLOVE) ×5 IMPLANT
GOWN STRL REUS W/ TWL LRG LVL3 (GOWN DISPOSABLE) ×18 IMPLANT
GOWN STRL REUS W/TWL 2XL LVL3 (GOWN DISPOSABLE) ×10 IMPLANT
GOWN STRL REUS W/TWL LRG LVL3 (GOWN DISPOSABLE) ×12
KIT BASIN OR (CUSTOM PROCEDURE TRAY) ×10 IMPLANT
KIT TUBE JEJUNAL 16FR (CATHETERS) IMPLANT
KIT TURNOVER KIT B (KITS) ×10 IMPLANT
L-HOOK LAP DISP 36CM (ELECTROSURGICAL) ×5
LHOOK LAP DISP 36CM (ELECTROSURGICAL) ×3 IMPLANT
LOOP VESSEL MAXI BLUE (MISCELLANEOUS) ×3 IMPLANT
NS IRRIG 1000ML POUR BTL (IV SOLUTION) ×30 IMPLANT
PACK CHEST (CUSTOM PROCEDURE TRAY) ×2 IMPLANT
PACK GENERAL/GYN (CUSTOM PROCEDURE TRAY) ×5 IMPLANT
PAD ARMBOARD 7.5X6 YLW CONV (MISCELLANEOUS) ×20 IMPLANT
PAD DRESSING TELFA 3X8 NADH (GAUZE/BANDAGES/DRESSINGS) IMPLANT
PASSER SUT SWANSON 36MM LOOP (INSTRUMENTS) IMPLANT
PENCIL BUTTON HOLSTER BLD 10FT (ELECTRODE) ×2 IMPLANT
PENCIL SMOKE EVAC W/HOLSTER (ELECTROSURGICAL) ×3 IMPLANT
RELOAD PROXIMATE 75MM BLUE (ENDOMECHANICALS) ×20 IMPLANT
RELOAD STAPLE 60 2.6 WHT THN (STAPLE) IMPLANT
RELOAD STAPLE 60 3.6 BLU REG (STAPLE) IMPLANT
RELOAD STAPLE 75 3.8 BLU REG (ENDOMECHANICALS) IMPLANT
RELOAD STAPLER BLUE 60MM (STAPLE) ×12 IMPLANT
RELOAD STAPLER WHITE 60MM (STAPLE) ×12 IMPLANT
SCISSORS LAP 5X35 DISP (ENDOMECHANICALS) IMPLANT
SET IRRIG TUBING LAPAROSCOPIC (IRRIGATION / IRRIGATOR) ×3 IMPLANT
SHEARS FOC LG CVD HARMONIC 17C (MISCELLANEOUS) ×3 IMPLANT
SLEEVE ENDOPATH XCEL 5M (ENDOMECHANICALS) ×5 IMPLANT
SOLUTION ANTI FOG 6CC (MISCELLANEOUS) ×2 IMPLANT
SPECIMEN JAR LARGE (MISCELLANEOUS) ×5 IMPLANT
SPECIMEN JAR LG PLASTIC EMPTY (MISCELLANEOUS) IMPLANT
SPECIMEN JAR MEDIUM (MISCELLANEOUS) IMPLANT
SPONGE LAP 18X18 RF (DISPOSABLE) ×15 IMPLANT
SPONGE LAP 18X18 X RAY DECT (DISPOSABLE) IMPLANT
STAPLE ECHEON FLEX 60 POW ENDO (STAPLE) ×3 IMPLANT
STAPLER PROXIMATE 75MM BLUE (STAPLE) ×3 IMPLANT
STAPLER RELOAD BLUE 60MM (STAPLE) ×20
STAPLER RELOAD WHITE 60MM (STAPLE) ×20
STAPLER VISISTAT 35W (STAPLE) ×8 IMPLANT
STRIP PERI DRY VERITAS 60 (STAPLE) ×3 IMPLANT
SUT ETHILON 2 0 FS 18 (SUTURE) ×15 IMPLANT
SUT MNCRL AB 4-0 PS2 18 (SUTURE) ×2 IMPLANT
SUT PDS AB 1 TP1 96 (SUTURE) ×16 IMPLANT
SUT PDS AB 3-0 SH 27 (SUTURE) ×12 IMPLANT
SUT PROLENE 3 0 SH DA (SUTURE) ×3 IMPLANT
SUT PROLENE 4 0 RB 1 (SUTURE) ×4
SUT PROLENE 4-0 RB1 .5 CRCL 36 (SUTURE) ×2 IMPLANT
SUT SILK  1 MH (SUTURE)
SUT SILK 1 MH (SUTURE) ×8 IMPLANT
SUT SILK 1 TIES 10X30 (SUTURE) IMPLANT
SUT SILK 2 0 SH CR/8 (SUTURE) ×5 IMPLANT
SUT SILK 2 0 TIES 10X30 (SUTURE) ×5 IMPLANT
SUT SILK 2 0SH CR/8 30 (SUTURE) ×3 IMPLANT
SUT SILK 3 0 SH CR/8 (SUTURE) ×8 IMPLANT
SUT SILK 3 0 TIES 10X30 (SUTURE) ×5 IMPLANT
SUT SILK 3 0SH CR/8 30 (SUTURE) IMPLANT
SUT VIC AB 1 CTX 18 (SUTURE) IMPLANT
SUT VIC AB 1 CTX 36 (SUTURE)
SUT VIC AB 1 CTX36XBRD ANBCTR (SUTURE) IMPLANT
SUT VIC AB 2-0 CTX 36 (SUTURE) IMPLANT
SUT VIC AB 2-0 SH 27 (SUTURE) ×2
SUT VIC AB 2-0 SH 27X BRD (SUTURE) ×1 IMPLANT
SUT VIC AB 3-0 X1 27 (SUTURE) IMPLANT
SUT VICRYL 2 TP 1 (SUTURE) IMPLANT
SYSTEM SAHARA CHEST DRAIN ATS (WOUND CARE) ×2 IMPLANT
TAPE UMBILICAL COTTON 1/8X30 (MISCELLANEOUS) ×5 IMPLANT
TOWEL GREEN STERILE (TOWEL DISPOSABLE) ×5 IMPLANT
TOWEL GREEN STERILE FF (TOWEL DISPOSABLE) ×5 IMPLANT
TOWEL OR 17X24 6PK STRL BLUE (TOWEL DISPOSABLE) ×5 IMPLANT
TOWEL OR 17X26 10 PK STRL BLUE (TOWEL DISPOSABLE) ×5 IMPLANT
TRAP SPECIMEN MUCOUS 40CC (MISCELLANEOUS) IMPLANT
TRAY FOLEY CATH 16FRSI W/METER (SET/KITS/TRAYS/PACK) ×5 IMPLANT
TRAY FOLEY MTR SLVR 16FR STAT (SET/KITS/TRAYS/PACK) ×2 IMPLANT
TRAY LAPAROSCOPIC MC (CUSTOM PROCEDURE TRAY) ×5 IMPLANT
TROCAR XCEL BLUNT TIP 100MML (ENDOMECHANICALS) IMPLANT
TROCAR XCEL NON-BLD 11X100MML (ENDOMECHANICALS) IMPLANT
TROCAR XCEL NON-BLD 5MMX100MML (ENDOMECHANICALS) ×5 IMPLANT
TUBE J 18FR (TUBING) ×3 IMPLANT
TUBING INSUFFLATION (TUBING) ×5 IMPLANT
TUNNELER SHEATH ON-Q 11GX8 (MISCELLANEOUS) ×3 IMPLANT
TUNNELER SHEATH ON-Q 11GX8 DSP (PAIN MANAGEMENT) IMPLANT
TUNNELER SHEATH ON-Q 16GX12 DP (PAIN MANAGEMENT) ×3 IMPLANT
WATER STERILE IRR 1000ML POUR (IV SOLUTION) ×4 IMPLANT
YANKAUER SUCT BULB TIP NO VENT (SUCTIONS) IMPLANT

## 2018-04-22 NOTE — Anesthesia Procedure Notes (Signed)
Arterial Line Insertion Start/End12/02/2018 7:00 AM, 04/22/2018 7:08 AM Performed by: Albertha Ghee, MD, Marsa Aris, CRNA, CRNA  Patient location: Pre-op. Preanesthetic checklist: patient identified, IV checked, site marked, risks and benefits discussed, surgical consent, monitors and equipment checked, pre-op evaluation, timeout performed and anesthesia consent Lidocaine 1% used for infiltration Right, radial was placed Catheter size: 20 Fr Hand hygiene performed , maximum sterile barriers used  and Seldinger technique used Allen's test indicative of satisfactory collateral circulation Attempts: 1 Procedure performed without using ultrasound guided technique. Following insertion, dressing applied. Post procedure assessment: normal and unchanged  Patient tolerated the procedure well with no immediate complications.

## 2018-04-22 NOTE — H&P (Signed)
[address]       [place] [address]             [phone]                    Ahmon Lee Remmers Humphrey Medical Record #[id number] Date of Birth: [birth year]  Referring: No ref. provider found Primary Care: Patient, No Pcp Per Primary Cardiologist: No primary care provider on file.  Chief Complaint:    No chief complaint on file.   History of Present Illness:    Leiby Pigeon 67 y.o. male is seen in the office referred by the [facility] for gastric carcinoma.  The patient originally presented in early 2019 to the [place]. he noted difficulty swallowing.  In addition he had weight loss and intermittent epigastric pain.  A EGD was done at the [facility] June 13, 2017, we have no images of this report notes a large friable mass extending from the GE junction to the fundus of the stomach 42 to 51 cm.  Per the reports from the [place] Mrs. a moderately differentiated adenocarcinoma.  The patient then had a EUS performed in [place] at [facility].  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  The patient started on a series of treatments with weekly carboplatin and Taxol and radiation.  He started radiation therapy August 14, 2017 and completed it  He completed radiation chemotherapy in May.    I tried to contact the [facility] system and his oncologist would been caring for him is no longer working there.  Dr. Randell Patient was able to give me some of the information in his care.  The patient now notes that he is gaining some of his weight back initially had lost 20 to 25 pounds.  He is taking a p.o. diet without difficulty.  Denies any abdominal pain.  Denies regurgitation.  The PET scan on the disc was sent with patient has been loaded into the [facility] PACS system, the scan was done in August. -Compared to the scan done in March, there is no hypermetabolic activity in the stomach area.  Current Activity/ Functional Status:  Patient is independent with  mobility/ambulation, transfers, ADL's, IADL's.   Zubrod Score: At the time of surgery this patient's most appropriate activity status/level should be described as: [x]     0    Normal activity, no symptoms []     1    Restricted in physical strenuous activity but ambulatory, able to do out light work []     2    Ambulatory and capable of self care, unable to do work activities, up and about               >50 % of waking hours                              []     3    Only limited self care, in bed greater than 50% of waking hours []     4    Completely disabled, no self care, confined to bed or chair []     5    Moribund   Past Medical History:  Diagnosis Date  . BPH (benign prostatic hyperplasia)       . Colonic polyp   . Diabetes mellitus ([place])   . 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 GERD (gastroesophageal reflux disease)   . Hyperlipemia   . Hypertension   . Hypothyroidism   . Nicotine dependence   . Tinea pedis     Past Surgical History:  Procedure Laterality Date  . BIOPSY  03/06/2018   Procedure: BIOPSY;  Surgeon: Milus Banister, MD;  Location: [facility] ENDOSCOPY;  Service: Endoscopy;;  . COLONOSCOPY    . EUS N/A 03/06/2018   Procedure: UPPER ENDOSCOPIC ULTRASOUND (EUS) RADIAL;  Surgeon: Milus Banister, MD;  Location: [facility] ENDOSCOPY;  Service: Endoscopy;  Laterality: N/A;  . TONSILLECTOMY    . UPPER GI ENDOSCOPY     06/13/2017    Family History  Problem Relation Age of Onset  . Throat cancer Father      Social History   Tobacco Use  Smoking Status Current Every Day Smoker  . Packs/day: 1.00  . Years: 50.00  . Pack years: 50.00  Smokeless Tobacco Never Used    Social History   Substance and Sexual Activity  Alcohol Use Yes   Comment: per patient no longer drinking (04/16/2018);HARD LIQUOR X 50 YRS, NOW BEER     No Known Allergies  Current Facility-Administered Medications  Medication Dose Route Frequency Provider  Last Rate Last Dose  . acetaminophen (TYLENOL) tablet 1,000 mg  1,000 mg Oral On Call to OR Stark Klein, MD      . ceFAZolin (ANCEF) IVPB 2g/100 mL premix  2 g Intravenous On Call to OR Stark Klein, MD      . Chlorhexidine Gluconate Cloth 2 % PADS 6 each  6 each Topical Once Stark Klein, MD       And  . Chlorhexidine Gluconate Cloth 2 % PADS 6 each  6 each Topical Once Stark Klein, MD        Pertinent items are noted in HPI.   Review of Systems:     Cardiac Review of Systems: [Y] = yes  or   [ N ] = no   Chest Pain [ n   ]  Resting SOB [ n  ] Exertional SOB  [ y ]  Orthopnea [  n]   Pedal Edema [n   ]    Palpitations [n  ] Syncope  [  n]   Presyncope [  n ]   General Review of Systems: [Y] = yes [  ]=no Constitional: recent weight change [ y ];  Wt loss over the last 3 months Basilie.Lighter   ] anorexia Blue.Reese  ]; fatigue [ y ]; nausea [  ]; night sweats [  ]; fever [  ]; or chills [  ];           Eye : blurred vision [  ]; diplopia [   ]; vision changes [  ];  Amaurosis fugax[  ]; Resp: cough [  ];  wheezing[  ];  hemoptysis[  ]; shortness of breath[  ]; paroxysmal nocturnal dyspnea[  ]; dyspnea on exertion[  ]; or orthopnea[  ];  GI:  gallstones[  ], vomiting[  ];  dysphagia[  ]; melena[  ];  hematochezia [  ]; heartburn[  ];   Hx of  Colonoscopy[  ]; GU: kidney stones [  ]; hematuria[  ];   dysuria [  ];  nocturia[  ];  history of     obstruction [  ]; urinary frequency [  ]             Skin: rash, swelling[  ];,  hair loss[  ];  peripheral edema[  ];  or itching[  ]; Musculosketetal: myalgias[  ];  joint swelling[  ];  joint erythema[  ];  joint pain[  ];  back pain[  ];  Heme/Lymph: bruising[  ];  bleeding[  ];  anemia[  ];  Neuro: TIA[  ];  headaches[  ];  stroke[  ];  vertigo[  ];  seizures[  ];   paresthesias[  ];  difficulty walking[  ];  Psych:depression[  ]; anxiety[  ];  Endocrine: diabetes[  ];  thyroid dysfunction[  ];  Immunizations: Flu up to date [  ]; Pneumococcal up to date [   ];  Other:     PHYSICAL EXAMINATION: BP (!) 125/95   Pulse 60   Temp (!) 97.5 F (36.4 C) (Oral)   Resp 18   Ht 5\' 6"  (1.676 m)   Wt 52 kg   SpO2 100%   BMI 18.50 kg/m  General appearance: alert, cooperative and slowed mentation Head: Normocephalic, without obvious abnormality, atraumatic Neck: no adenopathy, no carotid bruit, no JVD, supple, symmetrical, trachea midline and thyroid not enlarged, symmetric, no tenderness/mass/nodules Lymph nodes: Cervical, supraclavicular, and axillary nodes normal. Resp: clear to auscultation bilaterally Back: symmetric, no curvature. ROM normal. No CVA tenderness. Cardio: regular rate and rhythm, S1, S2 normal, no murmur, click, rub or gallop GI: soft, non-tender; bowel sounds normal; no masses,  no organomegaly Extremities: extremities normal, atraumatic, no cyanosis or edema Neurologic: Grossly normal  Diagnostic Studies & Laboratory data:     Recent Radiology Findings:   Patient's PET scan from the [facility] [place] has been loaded into the PACS system   I have independently reviewed the above radiology studies /PET scan  and reviewed the findings with the patient.   Recent Lab Findings: Lab Results  Component Value Date   WBC 4.3 04/16/2018   HGB 13.2 04/16/2018   HCT 39.6 04/16/2018   PLT 184 04/16/2018   GLUCOSE 125 (H) 04/16/2018   ALT 17 04/16/2018   AST 19 04/16/2018   NA 137 04/16/2018   K 4.4 04/16/2018   CL 102 04/16/2018   CREATININE 0.82 04/16/2018   BUN 14 04/16/2018   CO2 27 04/16/2018   INR 1.00 04/16/2018   HGBA1C 6.5 (H) 04/16/2018   Findings: 1. There was a large, very friable mass from the GE junction to about mid gastric body. This was so friable and bloody that it was difficult to visualize very well but it is clearly NOT circumferential (probably occupies 2/3 of the lumen circumference), the most proximal aspect of the tumor is just at the GE junction and it is about 7-8cm in cranial-caudal length. I sampled the  mass with biopsy. 2. The most distal 1/3 to 1/2 of the stomach is normal appearing. ENDOSONOGRAPHIC FINDING: 1. The mass above correlates with an irregular very thickened gastric wall. Since he has had radiation, some of the findings may be from radiation related effect. If all of the findings are from the cancer he is at least T3. 2. There were scattered, supicious appearing perigastric lymphnodes mearuring 6-4mm across. 3. Limited views of the liver, pancreas were normal. Large, friable proximal gastric mass with most proximal aspect located at the GE junction. This is non-circumferential, probably 7-8cm in length and by EUS imaging is uT3N1. Note that radiation related effect from his neoadjuvant treatment can lead to false overstaging of tumors. The mass was biopsied with forceps.  Stomach, biopsy, known adenocarcinoma distally - ADENOCARCINOMA  IN A BACKGROUND OF HIGH GRADE DYSPLASIA.  Assessment / Plan:   Patient presents approximately 6 months post treatment for what appears to be a proximal gastric carcinoma GE junction to the fundus, Siewert 3.  I have discussed with the patient proceeding with surgical resection, with Dr Barry Dienes. If needed to get proximal margin will be available to move to the right chest.  The goals risks and alternatives of the planned surgical procedure Procedure(s) with comments: LAPAROSCOPY DIAGNOSTIC ERAS PATHWAY (N/A) - EPIDURAL SUBTOTAL VS TOTAL GASTRECTOMY (N/A) possible right THORACOTOMY MAJOR (Right)  have been discussed with the patient in detail. The risks of the procedure including death, infection, stroke, myocardial infarction, anastomotic leak  bleeding, blood transfusion have all been discussed specifically.  I have quoted Janalyn Shy a 4% of perioperative mortality and a complication rate as high as 40%. The patient's questions have been answered.Micholas Drumwright is willing  to proceed with the planned procedure.      Grace Isaac  MD      [address] [place] [address] Office [phone]   Beeper [phone]  04/22/2018 7:20 AM

## 2018-04-22 NOTE — Progress Notes (Signed)
Pt received from PACU. Pt given CHG bath. Tele notified. A lone zeroed and great wave form. Vitals stable.  Jerald Kief, RN

## 2018-04-22 NOTE — Op Note (Signed)
PRE-OPERATIVE DIAGNOSIS: Gastric cancer, uT3N1  POST-OPERATIVE DIAGNOSIS:  Gastric cancer, probable T4N2M1  PROCEDURE:  Procedure(s): Diagnostic laparoscopy, total gastrectomy with en bloc splenectomy and distal pancreatectomy, reconstruction with Roux-en-Y esophago-jejunostomy  SURGEON:  Surgeon(s): Stark Klein, MD  ASSISTANT: Dr. Ceasar Mons  ANESTHESIA:   general  DRAINS: Jejunostomy Tube and (19 Fr) Blake drain(s) in the right mid abdomen   LOCAL MEDICATIONS USED:  MARCAINE     SPECIMEN:  Source of Specimen:  liver surface lesions  Ascites for cytology Total gastrectomy en block with spleen and distal pancreas  DISPOSITION OF SPECIMEN:  PATHOLOGY  COUNTS:  YES  DICTATION: .Dragon Dictation  PLAN OF CARE: Admit to inpatient   PATIENT DISPOSITION:  PACU - hemodynamically stable.  FINDINGS:  Small superficial liver lesions, frozen section negative for cancer. Large gastric tumor with matted nodes along greater curve with tethered omentum.  Adherence to transverse colon mesentery, matted nodes in the hilum of the spleen.  Adherent to distal pancreas.  EBL: 400 mL  PROCEDURE:   Patient was identified in the holding area and taken the operating room where he was placed supine on the operating room table.  General endotracheal anesthesia was induced.  His arms were tucked, Foley catheter was placed, and his abdomen and lower chest were prepped and draped in sterile fashion.  A timeout was performed according to the surgical safety checklist.  When all was correct, we continued.  Patient was placed into reverse Trendelenburg position and rotated to the right.  Local anesthetic was administered at the costal margin.  A 5 mm incision was made and a Optiview trocar was placed under direct visualization.  A second 5 mm port was placed in the midline.  There were several small superficial nodules on the liver.  These were sent for frozen section with the laparoscopic biopsy forceps.   The liver surface was cauterized with the Bovie.  There was also ascites in the right upper quadrant of the liver.  This was aspirated and sent for cytology.  The frozen section returned as bile duct hematoma.  This information, the laparoscopic equipment was passed off the table.  A vertical incision was made in the abdomen from the xiphoid to just below the umbilicus.  The subcutaneous tissues including the fascia were opened with the cautery.  The Bookwalter self-retaining retractor was placed to assist with visualization.  The mass was seen to be quite large.  The mass went to approximately 4 cm proximal to the pylorus.  The duodenum was kocherized.  The omentum was taken off of the colon.  There was a segment approximately in the mid transverse colon where the tumor was adherent to the transverse colon mesentery.  This section of mesentery was resected en bloc with the stomach.  The mesenteric defect was closed with running Vicryl suture.  The esophagus at the hiatus did appear to be free of tumor.  However the proximal extent of the tumor was just below the GE junction.  The stomach was sequentially mobilized.  It became apparent that there were significant matted nodes in the hilum of the spleen.  The splenic attachments of the diaphragm were taken down.  The spleen was rotated medially.  Unfortunately, the tumor in the stomach was also adherent to the surface of the pancreas.  Careful dissection was used to try to free this up off the surface of the pancreas, but it continued to remain adherent.  The splenic artery and vein were skeletonized and stapled with  a vascular load of the Echelon stapler.  The distal pancreas was dissected and divided with the Echelon stapler blue load with Peri-Strips.  The stomach was mobilized additionally.  The vagus nerve was skeletonized off the top and the bottom of the esophagus and this was clipped and divided sharply.  The proximal duodenum just past the pylorus was  divided with a blue load of the Echelon stapler.  The tissues underneath it adherent to the pancreas were taken down with the cautery and the harmonic scalpel.  The left gastric artery was skeletonized and divided with the vascular load of the Echelon stapler.  Once the stomach was completely mobilized, the distal esophagus was divided with the stapler as well.  Stay sutures were placed on the distal esophagus to keep it from retracting into the chest.  The specimen was passed off for frozen section of the margins.  The abdomen was irrigated.  There were a few areas requiring hemostasis in the left upper quadrant from the diaphragmatic attachments to the spleen, at the edge of the pancreas, and in the right upper quadrant at the site of former omental attachments.  Hemostasis was achieved with cautery and suture.  At this point, the jejunum was identified at the ligament of Treitz and traced distally.  A good point for creation of the Roux-en-Y limb was identified and the GIA 75 stapler was used to transect it.  The mesentery was taken down radially in order to mobilize the small intestine upward for anastomosis.  The small bowel reached easily.  The small bowel to small bowel anastomosis was created with the GIA 75 stapler and the defect was closed using 3-0 PDS in [place] fashion.  An appropriate site downstream of the jejunojejunostomy was identified to the J-tube.  A 3-0 silk suture pursestring suture was placed in the jejunum.  The J-tube was passed to the abdominal wall in the left abdomen.  The J-tube had several additional holes cut in the side to minimize risk of clogging.  The J-tube was then advanced into the jejunum after opening up the site and the pursestring.  This was passed all the way and passed the balloon.  1 cc of air was placed in the balloon.  The J-tube flushed easily.  The J-tube was secured with the pursestring suture internally.  3-0 silk sutures were used to Witzel the J-tube.  The  J-tube was then secured to the abdominal wall with 2-0 silk sutures.  The J-tube flushed easily.  At this point the margins returned as negative.  A over lapping end-to-end anastomosis was created.  This was side to side with the jejunal limb lying posterior to the esophageal.  Stay sutures were placed with 3-0 silk.  The end of the esophagus was open as well as a spot in the jejunum.  The Echelon stapler was used to create an anastomosis.  This defect was also closed with 3-0 PDS in [place] fashion after taking care to past the NG tube into the efferent limb.  Drain was placed overlying the pancreatic stump.  This was secured with a 3-0 nylon.  The jejunal feeding tube was secured with multiple 3 oh nylons.  Evicel was placed on the pancreatic stump.  The abdomen was examined for bleeding and no evidence of bleeding was seen.  The On-Q tunnelers were then placed on either side of the fascial incision.  The fascia was closed using #1 looped PDS suture x2.  The skin was then irrigated and  closed using staples.  The knot was tucked.  The catheters for the On-Q were advanced to the tunnelers.  The dressings were then placed after cleaning the abdominal wall.  Needle, sponge, and instrument counts were correct x2.  The patient was extubated.  The patient remained in the OR on PACU hold immediately after the operation.

## 2018-04-22 NOTE — Anesthesia Procedure Notes (Signed)
Procedure Name: Intubation Date/Time: 04/22/2018 7:54 AM Performed by: Marsa Aris, CRNA Pre-anesthesia Checklist: Patient identified, Emergency Drugs available, Suction available and Patient being monitored Patient Re-evaluated:Patient Re-evaluated prior to induction Oxygen Delivery Method: Circle System Utilized Preoxygenation: Pre-oxygenation with 100% oxygen Induction Type: IV induction Ventilation: Mask ventilation without difficulty Laryngoscope Size: Miller and 2 Grade View: Grade I Tube type: Oral Tube size: 7.5 mm Number of attempts: 1 Airway Equipment and Method: Stylet and Oral airway Placement Confirmation: ETT inserted through vocal cords under direct vision,  positive ETCO2 and breath sounds checked- equal and bilateral Secured at: 21 cm Tube secured with: Tape Dental Injury: Teeth and Oropharynx as per pre-operative assessment

## 2018-04-22 NOTE — Interval H&P Note (Signed)
History and Physical Interval Note:  04/22/2018 7:29 AM  Nathan Massey  has presented today for surgery, with the diagnosis of gastric cancer  The various methods of treatment have been discussed with the patient and family. After consideration of risks, benefits and other options for treatment, the patient has consented to  Procedure(s) with comments: LAPAROSCOPY DIAGNOSTIC ERAS PATHWAY (N/A) - EPIDURAL SUBTOTAL VS TOTAL GASTRECTOMY (N/A) possible right THORACOTOMY MAJOR (Right) as a surgical intervention .  The patient's history has been reviewed, patient examined, no change in status, stable for surgery.  I have reviewed the patient's chart and labs.  Questions were answered to the patient's satisfaction.     Stark Klein

## 2018-04-22 NOTE — Progress Notes (Signed)
Patient states that he had a cup of coffee with cream at about 0315.  Patient stated that it was about 2 spoonfuls of creamer.  Dr. Marcie Bal made aware and stated will proceed.

## 2018-04-22 NOTE — Progress Notes (Signed)
DOS, when asking questions, patient stated that he had no bowel prep, and he did have 2 cups of coffee, first one was just black, 2nd one was with creamer.   Hasn't taken his bp nor diabetes meds in 2 days. He has already taken his 1 gm of tylenol this am 0315

## 2018-04-22 NOTE — Transfer of Care (Signed)
Immediate Anesthesia Transfer of Care Note  Patient: Nathan Massey  Procedure(s) Performed: LAPAROSCOPY DIAGNOSTIC ERAS PATHWAY (N/A Abdomen) TOTAL GASTRECTOMY (N/A Abdomen) DISTAL PANCREATECTOMY (N/A Abdomen) SPLENECTOMY (N/A Abdomen) INSERTION OF JEJUNOSTOMY TUBE (Left Abdomen)  Patient Location: PACU  Anesthesia Type:General  Level of Consciousness: awake, alert  and oriented  Airway & Oxygen Therapy: Patient Spontanous Breathing and Patient connected to face mask oxygen  Post-op Assessment: Report given to RN and Post -op Vital signs reviewed and stable  Post vital signs: Reviewed and stable  Last Vitals:  Vitals Value Taken Time  BP 111/76 04/22/2018  1:15 PM  Temp    Pulse 65 04/22/2018  1:17 PM  Resp 23 04/22/2018  1:17 PM  SpO2 100 % 04/22/2018  1:17 PM  Vitals shown include unvalidated device data.  Last Pain:  Vitals:   04/22/18 0625  TempSrc: Oral  PainSc:          Complications: No apparent anesthesia complications

## 2018-04-22 NOTE — Anesthesia Preprocedure Evaluation (Addendum)
Anesthesia Evaluation  Patient identified by MRN, date of birth, ID band Patient awake    Reviewed: Allergy & Precautions, H&P , NPO status , Patient's Chart, lab work & pertinent test results  Airway Mallampati: II   Neck ROM: full    Dental   Pulmonary Current Smoker,    breath sounds clear to auscultation       Cardiovascular hypertension,  Rhythm:regular Rate:Normal     Neuro/Psych    GI/Hepatic GERD  ,Gastric CA   Endo/Other  diabetes, Type 2Hypothyroidism   Renal/GU      Musculoskeletal  (+) Arthritis ,   Abdominal   Peds  Hematology   Anesthesia Other Findings   Reproductive/Obstetrics                             Anesthesia Physical Anesthesia Plan  ASA: II  Anesthesia Plan: General   Post-op Pain Management:    Induction: Intravenous  PONV Risk Score and Plan: 1 and Ondansetron, Dexamethasone, Midazolam and Treatment may vary due to age or medical condition  Airway Management Planned: Oral ETT  Additional Equipment: Arterial line  Intra-op Plan:   Post-operative Plan: Extubation in OR  Informed Consent: I have reviewed the patients History and Physical, chart, labs and discussed the procedure including the risks, benefits and alternatives for the proposed anesthesia with the patient or authorized representative who has indicated his/her understanding and acceptance.     Plan Discussed with: CRNA, Anesthesiologist and Surgeon  Anesthesia Plan Comments:         Anesthesia Quick Evaluation

## 2018-04-23 ENCOUNTER — Encounter (HOSPITAL_COMMUNITY): Payer: Self-pay | Admitting: General Practice

## 2018-04-23 ENCOUNTER — Other Ambulatory Visit: Payer: Self-pay

## 2018-04-23 LAB — CBC
HCT: 25.8 % — ABNORMAL LOW (ref 39.0–52.0)
HEMOGLOBIN: 9 g/dL — AB (ref 13.0–17.0)
MCH: 32.5 pg (ref 26.0–34.0)
MCHC: 34.9 g/dL (ref 30.0–36.0)
MCV: 93.1 fL (ref 80.0–100.0)
Platelets: 160 10*3/uL (ref 150–400)
RBC: 2.77 MIL/uL — ABNORMAL LOW (ref 4.22–5.81)
RDW: 13.2 % (ref 11.5–15.5)
WBC: 6.7 10*3/uL (ref 4.0–10.5)
nRBC: 0 % (ref 0.0–0.2)

## 2018-04-23 LAB — POCT I-STAT 4, (NA,K, GLUC, HGB,HCT)
Glucose, Bld: 136 mg/dL — ABNORMAL HIGH (ref 70–99)
Glucose, Bld: 195 mg/dL — ABNORMAL HIGH (ref 70–99)
HCT: 30 % — ABNORMAL LOW (ref 39.0–52.0)
HCT: 24 % — ABNORMAL LOW (ref 39.0–52.0)
Hemoglobin: 10.2 g/dL — ABNORMAL LOW (ref 13.0–17.0)
Hemoglobin: 8.2 g/dL — ABNORMAL LOW (ref 13.0–17.0)
POTASSIUM: 3.8 mmol/L (ref 3.5–5.1)
Potassium: 3.8 mmol/L (ref 3.5–5.1)
Sodium: 134 mmol/L — ABNORMAL LOW (ref 135–145)
Sodium: 135 mmol/L (ref 135–145)

## 2018-04-23 LAB — POCT I-STAT 7, (LYTES, BLD GAS, ICA,H+H)
Acid-Base Excess: 3 mmol/L — ABNORMAL HIGH (ref 0.0–2.0)
Acid-base deficit: 1 mmol/L (ref 0.0–2.0)
BICARBONATE: 24.2 mmol/L (ref 20.0–28.0)
Bicarbonate: 28.2 mmol/L — ABNORMAL HIGH (ref 20.0–28.0)
CALCIUM ION: 1.16 mmol/L (ref 1.15–1.40)
Calcium, Ion: 1.07 mmol/L — ABNORMAL LOW (ref 1.15–1.40)
HCT: 31 % — ABNORMAL LOW (ref 39.0–52.0)
HCT: 22 % — ABNORMAL LOW (ref 39.0–52.0)
HEMOGLOBIN: 10.5 g/dL — AB (ref 13.0–17.0)
HEMOGLOBIN: 7.5 g/dL — AB (ref 13.0–17.0)
O2 Saturation: 100 %
O2 Saturation: 100 %
PCO2 ART: 40.3 mmHg (ref 32.0–48.0)
Patient temperature: 34.5
Patient temperature: 34.3
Potassium: 3.9 mmol/L (ref 3.5–5.1)
Potassium: 3.7 mmol/L (ref 3.5–5.1)
Sodium: 133 mmol/L — ABNORMAL LOW (ref 135–145)
Sodium: 134 mmol/L — ABNORMAL LOW (ref 135–145)
TCO2: 30 mmol/L (ref 22–32)
TCO2: 25 mmol/L (ref 22–32)
pCO2 arterial: 35.9 mmHg (ref 32.0–48.0)
pH, Arterial: 7.442 (ref 7.350–7.450)
pH, Arterial: 7.424 (ref 7.350–7.450)
pO2, Arterial: 316 mmHg — ABNORMAL HIGH (ref 83.0–108.0)
pO2, Arterial: 318 mmHg — ABNORMAL HIGH (ref 83.0–108.0)

## 2018-04-23 LAB — BASIC METABOLIC PANEL
Anion gap: 7 (ref 5–15)
BUN: 18 mg/dL (ref 8–23)
CO2: 24 mmol/L (ref 22–32)
Calcium: 8.2 mg/dL — ABNORMAL LOW (ref 8.9–10.3)
Chloride: 101 mmol/L (ref 98–111)
Creatinine, Ser: 1.22 mg/dL (ref 0.61–1.24)
GFR calc Af Amer: >60 mL/min (ref >60)
GFR calc non Af Amer: >60 mL/min (ref >60)
Glucose, Bld: 177 mg/dL — ABNORMAL HIGH (ref 70–99)
Potassium: 5.3 mmol/L — ABNORMAL HIGH (ref 3.5–5.1)
Sodium: 132 mmol/L — ABNORMAL LOW (ref 135–145)

## 2018-04-23 LAB — PREPARE RBC (CROSSMATCH)

## 2018-04-23 LAB — GLUCOSE, CAPILLARY
Glucose-Capillary: 167 mg/dL — ABNORMAL HIGH (ref 70–99)
Glucose-Capillary: 144 mg/dL — ABNORMAL HIGH (ref 70–99)
Glucose-Capillary: 100 mg/dL — ABNORMAL HIGH (ref 70–99)
Glucose-Capillary: 102 mg/dL — ABNORMAL HIGH (ref 70–99)
Glucose-Capillary: 124 mg/dL — ABNORMAL HIGH (ref 70–99)

## 2018-04-23 MED ORDER — DEXTROSE-NACL 5-0.45 % IV SOLN
INTRAVENOUS | Status: DC
  Administered 2018-04-23 – 2018-05-08 (×10): via INTRAVENOUS

## 2018-04-23 MED ORDER — JEVITY 1.2 CAL PO LIQD: 1000.0000 mL | ORAL | Status: DC

## 2018-04-23 MED ORDER — SODIUM CHLORIDE 0.9% IV SOLUTION
Freq: Once | INTRAVENOUS | Status: AC
  Administered 2018-04-23: 12:00:00 via INTRAVENOUS

## 2018-04-23 MED ORDER — HYDRALAZINE HCL 20 MG/ML IJ SOLN: 20.0000 mg | INTRAMUSCULAR | Status: DC | PRN

## 2018-04-23 MED ORDER — VITAL AF 1.2 CAL PO LIQD
1000.0000 mL | ORAL | Status: DC
  Administered 2018-04-23: 1000 mL
  Filled 2018-04-23 (×2): qty 1000

## 2018-04-23 NOTE — Plan of Care (Signed)

## 2018-04-23 NOTE — Progress Notes (Addendum)
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  The patient presented to the [facility] in [place] in February 2019 with dysphasia.  Work-up showed a 9 cm mass at the GE junction.  Pt s/p procedures 12/10: TOTAL GASTRECTOMY  DISTAL PANCREATECTOMY  SPLENECTOMY  INSERTION OF JEJUNOSTOMY TUBE   NGT to LIS  RD spoke with pt at bedside. He is resting, on his cell phone. Pt reports he was eating solid food PTA. Ate ~ 2 times per day. Some days he says he would not eat at all however.  Drank Ensure and/or Boost nutrition supplements occasionally. Pt also reveals he's lost approximately 30 lb since his cancer dx. Spoke with Dr. Barry Dienes this AM. Started trickle tube feeding at 10 ml/hr.  Plan for UGI to assess for leak in next 1-2 days. Labs & medications reviewed. CBG's [id number].  NUTRITION - FOCUSED PHYSICAL EXAM:    Most Recent Value  Orbital Region  Moderate depletion  Upper Arm Region  Moderate depletion  Thoracic and Lumbar Region  Unable to assess  Buccal Region  Moderate depletion  Temple  Region  Severe depletion  Clavicle Bone Region  Severe depletion  Clavicle and Acromion Bone Region  Severe depletion  Scapular Bone Region  Unable to assess  Dorsal Hand  Unable to assess  Patellar Region  Severe depletion  Anterior Thigh Region  Severe depletion  Posterior Calf Region  Severe depletion  Edema (RD Assessment)  None     Diet Order:   Diet Order            Diet NPO time specified  Diet effective now             EDUCATION NEEDS:   No education needs have been identified at this time  Skin:  Skin Assessment: Skin Integrity Issues: Skin Integrity Issues:: Incisions Incisions: surgical; abdominal  Last BM:  PTA   Intake/Output Summary (Last 24 hours) at 04/23/2018 1624 Last data filed at 04/23/2018 1600 Gross per 24 hour  Intake 2232.27 ml  Output 1315 ml  Net 917.27 ml   Height:   Ht Readings from Last 1 Encounters:  04/22/18 5\' 6"  (1.676 m)   Weight:   Wt Readings from Last 1 Encounters:  04/22/18 52 kg   BMI:  Body mass index is 18.5 kg/m.  Estimated Nutritional Needs:   Kcal:  1600-1800  Protein:  90-105 gm  Fluid:  1.6-1.8 L  Arthur Holms, RD, LDN Pager #: [phone] After-Hours Pager #: [phone]

## 2018-04-23 NOTE — Progress Notes (Signed)
1 Day Post-Op   Subjective/Chief Complaint: No acute events.  Had an episode of high BP.  Pain tolerable.     Objective: Vital signs in last 24 hours: Temp:  [97 F (36.1 C)-98 F (36.7 C)] 97.8 F (36.6 C) (12/11 0805) Pulse Rate:  [61-96] 76 (12/11 0805) Resp:  [14-24] 14 (12/11 0805) BP: (122-180)/(66-95) 126/66 (12/11 0805) SpO2:  [98 %-100 %] 100 % (12/11 0805) Arterial Line BP: (128-174)/(58-78) 138/58 (12/11 0800)    Intake/Output from previous day: 12/10 0701 - 12/11 0700 In: 4328.7 [I.V.:3628.7; IV Piggyback:700] Out: 1515 [Urine:800; Drains:265; Blood:450] Intake/Output this shift: No intake/output data recorded.  General appearance: alert, cooperative and no distress Resp: breathing comfortably Cardio: regular rate and rhythm GI: soft, non distended, approp tender.  some staining at lower dressing.  drain serosang Extremities: extremities normal, atraumatic, no cyanosis or edema  Lab Results:  Recent Labs    04/22/18 2040 04/23/18 0430  WBC 7.9 6.7  HGB 9.1* 9.0*  HCT 26.2* 25.8*  PLT 172 160   BMET Recent Labs    04/22/18 1106 04/22/18 2040 04/23/18 0430  NA 135  --  132*  K 3.8  --  5.3*  CL  --   --  101  CO2  --   --  24  GLUCOSE 195*  --  177*  BUN  --   --  18  CREATININE  --  1.00 1.22  CALCIUM  --   --  8.2*   PT/INR No results for input(s): LABPROT, INR in the last 72 hours. ABG Recent Labs    04/22/18 0918 04/22/18 1101  PHART 7.442 7.424  HCO3 28.2* 24.2    Studies/Results: No results found.  Anti-infectives: Anti-infectives (From admission, onward)   Start     Dose/Rate Route Frequency Ordered Stop   04/22/18 2200  ceFAZolin (ANCEF) IVPB 2g/100 mL premix     2 g 200 mL/hr over 30 Minutes Intravenous Every 8 hours 04/22/18 1935 04/22/18 2214   04/22/18 0600  ceFAZolin (ANCEF) IVPB 2g/100 mL premix     2 g 200 mL/hr over 30 Minutes Intravenous On call to O.R. 04/22/18 0544 04/22/18 1208      Assessment/Plan: s/p  Procedure(s): LAPAROSCOPY DIAGNOSTIC ERAS PATHWAY (N/A) TOTAL GASTRECTOMY (N/A) DISTAL PANCREATECTOMY (N/A) SPLENECTOMY (N/A) INSERTION OF JEJUNOSTOMY TUBE (Left)  Dx Gastric cancer, s/p neoadjuvant chemoradiation.  Continue foley due to urinary output monitoring  Pain control with PCA and onQ Prn hydralazine for hypertension SSI for DM  Start trophic tube feeds at 10 ml/hr today. Do not advance. Give 1 u prbcs given increase in Cr and marginal uop.  OOB D/c art line. NGT/NPO for another 1-2 days, then will plan UGI to assess for leak.     LOS: 1 day    Stark Klein 04/23/2018

## 2018-04-23 NOTE — Anesthesia Postprocedure Evaluation (Signed)
Anesthesia Post Note  Patient: Aldous Housel  Procedure(s) Performed: LAPAROSCOPY DIAGNOSTIC ERAS PATHWAY (N/A Abdomen) TOTAL GASTRECTOMY (N/A Abdomen) DISTAL PANCREATECTOMY (N/A Abdomen) SPLENECTOMY (N/A Abdomen) INSERTION OF JEJUNOSTOMY TUBE (Left Abdomen)     Patient location during evaluation: PACU Anesthesia Type: General Level of consciousness: awake and alert Pain management: pain level controlled Vital Signs Assessment: post-procedure vital signs reviewed and stable Respiratory status: spontaneous breathing, nonlabored ventilation, respiratory function stable and patient connected to nasal cannula oxygen Cardiovascular status: blood pressure returned to baseline and stable Postop Assessment: no apparent nausea or vomiting Anesthetic complications: no    Last Vitals:  Vitals:   04/23/18 1211 04/23/18 1403  BP: (!) 152/74   Pulse: 89   Resp: 15 18  Temp: 36.8 C   SpO2: 100% 100%    Last Pain:  Vitals:   04/23/18 1418  TempSrc:   PainSc: 0-No pain                 Glenis Musolf S

## 2018-04-23 NOTE — Progress Notes (Addendum)
      [address]       [place] [address]             [phone]       1 Day Post-Op Procedure(s) (LRB): LAPAROSCOPY DIAGNOSTIC ERAS PATHWAY (N/A) TOTAL GASTRECTOMY (N/A) DISTAL PANCREATECTOMY (N/A) SPLENECTOMY (N/A) INSERTION OF JEJUNOSTOMY TUBE (Left)  Subjective: Patient with some incisional pain this am  Objective: Vital signs in last 24 hours: Temp:  [97 F (36.1 C)-98 F (36.7 C)] 97.8 F (36.6 C) (12/11 0805) Pulse Rate:  [61-96] 76 (12/11 0805) Cardiac Rhythm: Normal sinus rhythm (12/11 0722) Resp:  [14-24] 14 (12/11 0805) BP: (122-180)/(66-95) 126/66 (12/11 0805) SpO2:  [98 %-100 %] 100 % (12/11 0805) Arterial Line BP: (128-174)/(60-78) 174/73 (12/10 1828)     Intake/Output from previous day: 12/10 0701 - 12/11 0700 In: 4328.7 [I.V.:3628.7; IV Piggyback:700] Out: 1515 [Urine:800; Drains:265; Blood:450]   Physical Exam:  Cardiovascular: RRR Pulmonary: Clear to auscultation bilaterally Abdomen: Soft, non tender, sporadic bowel sounds present. Extremities: SCDs in place Wounds: Dressings mostly clean and dry.     Lab Results: CBC: Recent Labs    04/22/18 2040 04/23/18 0430  WBC 7.9 6.7  HGB 9.1* 9.0*  HCT 26.2* 25.8*  PLT 172 160   BMET:  Recent Labs    04/22/18 1106 04/22/18 2040 04/23/18 0430  NA 135  --  132*  K 3.8  --  5.3*  CL  --   --  101  CO2  --   --  24  GLUCOSE 195*  --  177*  BUN  --   --  18  CREATININE  --  1.00 1.22  CALCIUM  --   --  8.2*    PT/INR: No results for input(s): LABPROT, INR in the last 72 hours. ABG:  INR: Will add last result for INR, ABG once components are confirmed Will add last 4 CBG results once components are confirmed  Assessment/Plan:  1. CV - SR in the 80's. 2.  Pulmonary - On 2 liters of oxygen via [place].  3. Drains with 265 cc output since surgery. 4. Mild hyperkalemia-potassium this am 5.3. IVF changed to no potassium supplement 5. Anemia-H and H stable at 9 and  25.8 6. Remove a line 7. Management per Dr. Ewell Poe M ZimmermanPA-C 04/23/2018,8:37 AM [phone]  Patient awake and alert, says he feels good, talking on phone with his family I have seen and examined Janalyn Shy and agree with the above assessment  and plan.  Grace Isaac MD Beeper [phone] Office [phone] 04/23/2018 9:28 AM

## 2018-04-24 DIAGNOSIS — E43 Unspecified severe protein-calorie malnutrition: Secondary | ICD-10-CM

## 2018-04-24 LAB — CBC
HCT: 25.1 % — ABNORMAL LOW (ref 39.0–52.0)
Hemoglobin: 8.4 g/dL — ABNORMAL LOW (ref 13.0–17.0)
MCH: 30.5 pg (ref 26.0–34.0)
MCHC: 33.5 g/dL (ref 30.0–36.0)
MCV: 91.3 fL (ref 80.0–100.0)
Platelets: 148 10*3/uL — ABNORMAL LOW (ref 150–400)
RBC: 2.75 MIL/uL — ABNORMAL LOW (ref 4.22–5.81)
RDW: 14.8 % (ref 11.5–15.5)
WBC: 6.8 10*3/uL (ref 4.0–10.5)
nRBC: 0 % (ref 0.0–0.2)

## 2018-04-24 LAB — GLUCOSE, CAPILLARY
Glucose-Capillary: 115 mg/dL — ABNORMAL HIGH (ref 70–99)
Glucose-Capillary: 121 mg/dL — ABNORMAL HIGH (ref 70–99)
Glucose-Capillary: 128 mg/dL — ABNORMAL HIGH (ref 70–99)
Glucose-Capillary: 135 mg/dL — ABNORMAL HIGH (ref 70–99)
Glucose-Capillary: 88 mg/dL (ref 70–99)
Glucose-Capillary: 91 mg/dL (ref 70–99)

## 2018-04-24 LAB — BASIC METABOLIC PANEL
Anion gap: 5 (ref 5–15)
BUN: 14 mg/dL (ref 8–23)
CO2: 28 mmol/L (ref 22–32)
Calcium: 8 mg/dL — ABNORMAL LOW (ref 8.9–10.3)
Chloride: 100 mmol/L (ref 98–111)
Creatinine, Ser: 0.87 mg/dL (ref 0.61–1.24)
GFR calc Af Amer: >60 mL/min (ref >60)
GFR calc non Af Amer: >60 mL/min (ref >60)
Glucose, Bld: 94 mg/dL (ref 70–99)
Potassium: 4.3 mmol/L (ref 3.5–5.1)
Sodium: 133 mmol/L — ABNORMAL LOW (ref 135–145)

## 2018-04-24 MED ORDER — VITAL AF 1.2 CAL PO LIQD
1000.0000 mL | ORAL | Status: DC
  Filled 2018-04-24 (×2): qty 1000

## 2018-04-24 NOTE — Progress Notes (Addendum)
NG Tube removed by patient on accident, MD Paged. Orders received not to replace NG Tube.

## 2018-04-24 NOTE — Progress Notes (Signed)
2 Days Post-Op   Subjective/Chief Complaint: Did well with start of trophic feeds.  No n/v.  No flatus or BM.   Objective: Vital signs in last 24 hours: Temp:  [97.4 F (36.3 C)-98.2 F (36.8 C)] 98.1 F (36.7 C) (12/12 0823) Pulse Rate:  [69-94] 75 (12/12 0823) Resp:  [11-20] 18 (12/12 0823) BP: (118-157)/(74-99) 120/77 (12/12 0823) SpO2:  [92 %-100 %] 97 % (12/12 0823) Arterial Line BP: (129-156)/(63-82) 132/63 (12/11 1152) Weight:  [53.6 kg] 53.6 kg (12/12 0409)    Intake/Output from previous day: 12/11 0701 - 12/12 0700 In: 2542.7 [I.V.:1665.1; Blood:342; NG/GT:187.3; IV Piggyback:188.3] Out: 1595 [Urine:1375; Drains:220] Intake/Output this shift: No intake/output data recorded.  General appearance: alert, cooperative and no distress Resp: breathing comfortably Cardio: regular rate and rhythm GI: soft, non distended, approp tender.  some staining at lower dressing.  drain serosang Extremities: extremities normal, atraumatic, no cyanosis or edema  Lab Results:  Recent Labs    04/23/18 0430 04/24/18 0248  WBC 6.7 6.8  HGB 9.0* 8.4*  HCT 25.8* 25.1*  PLT 160 148*   BMET Recent Labs    04/23/18 0430 04/24/18 0248  NA 132* 133*  K 5.3* 4.3  CL 101 100  CO2 24 28  GLUCOSE 177* 94  BUN 18 14  CREATININE 1.22 0.87  CALCIUM 8.2* 8.0*   PT/INR No results for input(s): LABPROT, INR in the last 72 hours. ABG Recent Labs    04/22/18 0918 04/22/18 1101  PHART 7.442 7.424  HCO3 28.2* 24.2    Studies/Results: No results found.  Anti-infectives: Anti-infectives (From admission, onward)   Start     Dose/Rate Route Frequency Ordered Stop   04/22/18 2200  ceFAZolin (ANCEF) IVPB 2g/100 mL premix     2 g 200 mL/hr over 30 Minutes Intravenous Every 8 hours 04/22/18 1935 04/22/18 2214   04/22/18 0600  ceFAZolin (ANCEF) IVPB 2g/100 mL premix     2 g 200 mL/hr over 30 Minutes Intravenous On call to O.R. 04/22/18 0544 04/22/18 1208       Assessment/Plan: s/p Procedure(s): LAPAROSCOPY DIAGNOSTIC ERAS PATHWAY (N/A) TOTAL GASTRECTOMY (N/A) DISTAL PANCREATECTOMY (N/A) SPLENECTOMY (N/A) INSERTION OF JEJUNOSTOMY TUBE (Left)  Dx Gastric cancer, s/p neoadjuvant chemoradiation.  Pathology pending.    D/c foley  Pain control with PCA and onQ Prn hydralazine for hypertension SSI for DM  Trophic feeds at 20 ml/hr.  Cr better.  UOP good.   UGI tomorrow to eval for leak.    LOS: 2 days    Stark Klein 04/24/2018

## 2018-04-24 NOTE — Progress Notes (Addendum)
      [address]       [place] [address]             [phone]      2 Days Post-Op Procedure(s) (LRB): LAPAROSCOPY DIAGNOSTIC ERAS PATHWAY (N/A) TOTAL GASTRECTOMY (N/A) DISTAL PANCREATECTOMY (N/A) SPLENECTOMY (N/A) INSERTION OF JEJUNOSTOMY TUBE (Left) Subjective: C/O incisional discomfort No current nausea, no flatus yet  Objective: Vital signs in last 24 hours: Temp:  [97.4 F (36.3 C)-98.2 F (36.8 C)] 98.1 F (36.7 C) (12/12 0823) Pulse Rate:  [69-94] 75 (12/12 0823) Cardiac Rhythm: Normal sinus rhythm;Bundle branch block (12/12 0701) Resp:  [11-20] 18 (12/12 0823) BP: (118-157)/(74-99) 120/77 (12/12 0823) SpO2:  [92 %-100 %] 97 % (12/12 0823) Arterial Line BP: (129-156)/(63-82) 132/63 (12/11 1152) Weight:  [53.6 kg] 53.6 kg (12/12 0409)  Hemodynamic parameters for last 24 hours:    Intake/Output from previous day: 12/11 0701 - 12/12 0700 In: 2542.7 [I.V.:1665.1; Blood:342; NG/GT:187.3; IV Piggyback:188.3] Out: 1595 [Urine:1375; Drains:220] Intake/Output this shift: No intake/output data recorded.  General appearance: alert, cooperative, fatigued and no distress Heart: regular rate and rhythm Lungs: clear to auscultation bilaterally Abdomen: + BS, + TTP with mild distension Extremities: no edema, PAS in place Wound: dressings with minor bloody drainage(old)  Lab Results: Recent Labs    04/23/18 0430 04/24/18 0248  WBC 6.7 6.8  HGB 9.0* 8.4*  HCT 25.8* 25.1*  PLT 160 148*   BMET:  Recent Labs    04/23/18 0430 04/24/18 0248  NA 132* 133*  K 5.3* 4.3  CL 101 100  CO2 24 28  GLUCOSE 177* 94  BUN 18 14  CREATININE 1.22 0.87  CALCIUM 8.2* 8.0*    PT/INR: No results for input(s): LABPROT, INR in the last 72 hours. ABG    Component Value Date/Time   PHART 7.424 04/22/2018 1101   HCO3 24.2 04/22/2018 1101   TCO2 25 04/22/2018 1101   ACIDBASEDEF 1.0 04/22/2018 1101   O2SAT 100.0 04/22/2018 1101   CBG (last 3)  Recent  Labs    04/24/18 0003 04/24/18 0402 04/24/18 0822  GLUCAP 88 91 121*    Meds Scheduled Meds: . enoxaparin (LOVENOX) injection  40 mg Subcutaneous Q24H  . HYDROmorphone   Intravenous Q4H  . insulin aspart  0-9 Units Subcutaneous Q4H   Continuous Infusions: . bupivacaine 0.25 % ON-Q pump DUAL CATH 300 mL    . dextrose 5 % and 0.45% NaCl 100 mL/hr at 04/24/18 0938  . feeding supplement (VITAL AF 1.2 CAL) 10 mL/hr at 04/24/18 0500  . methocarbamol (ROBAXIN) IV     PRN Meds:.diphenhydrAMINE **OR** diphenhydrAMINE, hydrALAZINE, methocarbamol (ROBAXIN) IV, naloxone **AND** sodium chloride flush, ondansetron (ZOFRAN) IV, prochlorperazine **OR** prochlorperazine  Xrays No results found.  Assessment/Plan: S/P Procedure(s) (LRB): LAPAROSCOPY DIAGNOSTIC ERAS PATHWAY (N/A) TOTAL GASTRECTOMY (N/A) DISTAL PANCREATECTOMY (N/A) SPLENECTOMY (N/A) INSERTION OF JEJUNOSTOMY TUBE (Left)  1 doing well POD#2 2 hemodyn stable in sinus rhythm  3 265 cc from "drains", good UOP 4 renal fxn in normal range 5 H/H are stable 6 no leukocytosis or fevers 7 tolerating TF's so far 8 primary management as per general surgery  LOS: 2 days    Nathan Massey [facility] 04/24/2018 Pager [phone]

## 2018-04-25 ENCOUNTER — Inpatient Hospital Stay (HOSPITAL_COMMUNITY): Payer: No Typology Code available for payment source

## 2018-04-25 LAB — GLUCOSE, CAPILLARY
GLUCOSE-CAPILLARY: 135 mg/dL — AB (ref 70–99)
Glucose-Capillary: 118 mg/dL — ABNORMAL HIGH (ref 70–99)
Glucose-Capillary: 125 mg/dL — ABNORMAL HIGH (ref 70–99)
Glucose-Capillary: 137 mg/dL — ABNORMAL HIGH (ref 70–99)
Glucose-Capillary: 147 mg/dL — ABNORMAL HIGH (ref 70–99)
Glucose-Capillary: 93 mg/dL (ref 70–99)

## 2018-04-25 LAB — BASIC METABOLIC PANEL
Anion gap: 7 (ref 5–15)
BUN: 7 mg/dL — ABNORMAL LOW (ref 8–23)
CALCIUM: 8.1 mg/dL — AB (ref 8.9–10.3)
CO2: 32 mmol/L (ref 22–32)
CREATININE: 0.63 mg/dL (ref 0.61–1.24)
Chloride: 94 mmol/L — ABNORMAL LOW (ref 98–111)
GFR calc Af Amer: >60 mL/min (ref >60)
GFR calc non Af Amer: >60 mL/min (ref >60)
Glucose, Bld: 88 mg/dL (ref 70–99)
Potassium: 3.8 mmol/L (ref 3.5–5.1)
Sodium: 133 mmol/L — ABNORMAL LOW (ref 135–145)

## 2018-04-25 LAB — CBC
HCT: 22.8 % — ABNORMAL LOW (ref 39.0–52.0)
Hemoglobin: 7.5 g/dL — ABNORMAL LOW (ref 13.0–17.0)
MCH: 30.5 pg (ref 26.0–34.0)
MCHC: 32.9 g/dL (ref 30.0–36.0)
MCV: 92.7 fL (ref 80.0–100.0)
Platelets: 155 10*3/uL (ref 150–400)
RBC: 2.46 MIL/uL — ABNORMAL LOW (ref 4.22–5.81)
RDW: 14.6 % (ref 11.5–15.5)
WBC: 5 10*3/uL (ref 4.0–10.5)
nRBC: 0 % (ref 0.0–0.2)

## 2018-04-25 MED ORDER — VITAL AF 1.2 CAL PO LIQD
1000.0000 mL | ORAL | Status: DC
  Administered 2018-04-25: 1000 mL
  Filled 2018-04-25 (×2): qty 1000

## 2018-04-25 MED ORDER — VITAL AF 1.2 CAL PO LIQD
1000.0000 mL | ORAL | Status: DC
  Filled 2018-04-25 (×2): qty 1000

## 2018-04-25 MED ORDER — IOHEXOL 300 MG/ML  SOLN
150.0000 mL | Freq: Once | INTRAMUSCULAR | Status: AC | PRN
  Administered 2018-04-25: 100 mL via ORAL

## 2018-04-25 NOTE — Progress Notes (Signed)
Patient's On-que pain pump DC'd per MD order without difficulty.  Will continue to monitor.

## 2018-04-25 NOTE — Progress Notes (Signed)
3 Days Post-Op   Subjective/Chief Complaint: No complaints.  Pain controlled.     Objective: Vital signs in last 24 hours: Temp:  [97.8 F (36.6 C)-98.9 F (37.2 C)] 97.8 F (36.6 C) (12/13 1253) Pulse Rate:  [65-80] 65 (12/13 1253) Resp:  [14-20] 18 (12/13 1253) BP: (101-140)/(65-82) 140/81 (12/13 1253) SpO2:  [94 %-100 %] 100 % (12/13 1253) Weight:  [54.1 kg] 54.1 kg (12/13 1253) Last BM Date: 04/15/18(per patient report last tuesday)  Intake/Output from previous day: 12/12 0701 - 12/13 0700 In: 2249.8 [I.V.:2189.8] Out: 1565 [Urine:1425; Drains:140] Intake/Output this shift: Total I/O In: 0  Out: 350 [Urine:350]  General appearance: alert, cooperative and no distress Resp: breathing comfortably Cardio: regular rate and rhythm GI: soft, non distended, approp tender.  some staining at lower dressing.  drain serosang.   J tube in place.  OnQ empty. Extremities: extremities normal, atraumatic, no cyanosis or edema  Lab Results:  Recent Labs    04/24/18 0248 04/25/18 0354  WBC 6.8 5.0  HGB 8.4* 7.5*  HCT 25.1* 22.8*  PLT 148* 155   BMET Recent Labs    04/24/18 0248 04/25/18 0354  NA 133* 133*  K 4.3 3.8  CL 100 94*  CO2 28 32  GLUCOSE 94 88  BUN 14 7*  CREATININE 0.87 0.63  CALCIUM 8.0* 8.1*   PT/INR No results for input(s): LABPROT, INR in the last 72 hours. ABG No results for input(s): PHART, HCO3 in the last 72 hours.  Invalid input(s): PCO2, PO2  Studies/Results: Dg Ugi W/water Sol Cm  Result Date: 04/25/2018 CLINICAL DATA:  Status post gastrectomy. EXAM: WATER SOLUBLE UPPER GI SERIES TECHNIQUE: Single-column upper GI series was performed using water soluble contrast. CONTRAST:  100 mL of Omnipaque 300 COMPARISON:  None. FLUOROSCOPY TIME:  Fluoroscopy Time:  1 minutes 36 seconds Number of Acquired Spot Images: 0 FINDINGS: There is mild narrowing at the anastomosis between the distal esophagus and jejunum, likely postoperative edema. However,  contrast does pass freely from the esophagus into the small bowel without evidence of leak. Contrast passes into the small bowel more distally with no evidence of bowel obstruction. IMPRESSION: 1. There is narrowing at the anastomosis between the esophagus and jejunum, probably postoperative edema. That being said, the contrast passes easily from the esophagus into the small bowel and there is no evidence of small bowel obstruction and no evidence of leak. Electronically Signed   By: Dorise Bullion III M.D   On: 04/25/2018 10:30    Anti-infectives: Anti-infectives (From admission, onward)   Start     Dose/Rate Route Frequency Ordered Stop   04/22/18 2200  ceFAZolin (ANCEF) IVPB 2g/100 mL premix     2 g 200 mL/hr over 30 Minutes Intravenous Every 8 hours 04/22/18 1935 04/22/18 2214   04/22/18 0600  ceFAZolin (ANCEF) IVPB 2g/100 mL premix     2 g 200 mL/hr over 30 Minutes Intravenous On call to O.R. 04/22/18 0544 04/22/18 1208      Assessment/Plan: s/p Procedure(s): LAPAROSCOPY DIAGNOSTIC ERAS PATHWAY (N/A) TOTAL GASTRECTOMY (N/A) DISTAL PANCREATECTOMY (N/A) SPLENECTOMY (N/A) INSERTION OF JEJUNOSTOMY TUBE (Left)  Dx Gastric cancer, s/p neoadjuvant chemoradiation.  Pathology pending.    Clear liquids.   Pain control with PCA  Prn hydralazine for hypertension SSI for DM  Advance tube feeds to goal.    Cr better.  UOP good.      LOS: 3 days    Stark Klein 04/25/2018

## 2018-04-25 NOTE — Progress Notes (Addendum)
      [address]       [place] [address]             [phone]       3 Days Post-Op Procedure(s) (LRB): LAPAROSCOPY DIAGNOSTIC ERAS PATHWAY (N/A) TOTAL GASTRECTOMY (N/A) DISTAL PANCREATECTOMY (N/A) SPLENECTOMY (N/A) INSERTION OF JEJUNOSTOMY TUBE (Left)  Subjective: Patient states incisional pain "not too bad". He denies nausea or vomiting.  Objective: Vital signs in last 24 hours: Temp:  [97.8 F (36.6 C)-98.9 F (37.2 C)] 98.4 F (36.9 C) (12/13 0807) Pulse Rate:  [69-80] 70 (12/13 0807) Cardiac Rhythm: Normal sinus rhythm (12/13 0431) Resp:  [14-22] 15 (12/13 0845) BP: (108-127)/(65-82) 108/65 (12/13 0807) SpO2:  [94 %-100 %] 100 % (12/13 0845)     Intake/Output from previous day: 12/12 0701 - 12/13 0700 In: 2249.8 [I.V.:2189.8] Out: 1565 [Urine:1425; Drains:140]   Physical Exam:  Cardiovascular: RRR Pulmonary: Clear to auscultation bilaterally Abdomen: Soft, non tender, sporadic bowel sounds present.  Extremities: SCDs in place Wounds: Dressings intact-dried bloody ooze mid abdominal dressing   Lab Results: CBC: Recent Labs    04/24/18 0248 04/25/18 0354  WBC 6.8 5.0  HGB 8.4* 7.5*  HCT 25.1* 22.8*  PLT 148* 155   BMET:  Recent Labs    04/24/18 0248 04/25/18 0354  NA 133* 133*  K 4.3 3.8  CL 100 94*  CO2 28 32  GLUCOSE 94 88  BUN 14 7*  CREATININE 0.87 0.63  CALCIUM 8.0* 8.1*    PT/INR: No results for input(s): LABPROT, INR in the last 72 hours. ABG:  INR: Will add last result for INR, ABG once components are confirmed Will add last 4 CBG results once components are confirmed  Assessment/Plan:  1. CV - SR in the 80's. 2.  Pulmonary - On 2 liters of oxygen via [place].  3. Anemia-H and H decreased to 7.5 and 22.8 this am. 4. GI-patient accidentally removed NGT yesterday. On TFs. UGI to be done this am to evaluate for anastomotic leak Drains with 140 cc output since surgery.  Nathan M  ZimmermanPA-C 04/25/2018,9:17 AM [phone]  Dg Duanne Limerick W/water Sol Cm  Result Date: 04/25/2018 CLINICAL DATA:  Status post gastrectomy. EXAM: WATER SOLUBLE UPPER GI SERIES TECHNIQUE: Single-column upper GI series was performed using water soluble contrast. CONTRAST:  100 mL of Omnipaque 300 COMPARISON:  None. FLUOROSCOPY TIME:  Fluoroscopy Time:  1 minutes 36 seconds Number of Acquired Spot Images: 0 FINDINGS: There is mild narrowing at the anastomosis between the distal esophagus and jejunum, likely postoperative edema. However, contrast does pass freely from the esophagus into the small bowel without evidence of leak. Contrast passes into the small bowel more distally with no evidence of bowel obstruction. IMPRESSION: 1. There is narrowing at the anastomosis between the esophagus and jejunum, probably postoperative edema. That being said, the contrast passes easily from the esophagus into the small bowel and there is no evidence of small bowel obstruction and no evidence of leak. Electronically Signed   By: Dorise Bullion III M.D   On: 04/25/2018 10:30   I have seen and examined Nathan Massey and agree with the above assessment  and plan.  Grace Isaac MD Beeper [phone] Office [phone] 04/25/2018 1:18 PM

## 2018-04-25 NOTE — Care Management Note (Signed)
Case Management Note  Patient Details  Name: Nathan Massey MRN: [id number] Date of Birth: 1950-09-09  Subjective/Objective:                    Action/Plan: Spoke to patient at bedside. Confirmed face sheet information. Patient is from home with his wife.   PCP is at [facility], however, he is unsure of PCP name. Explained to patient NCM will have to fax clinicals to his PCP , so PCP can approve prescriptions at discharge. Usually patient will have to be seen by [facility] PCP before [facility] will cover home health services.   Patient is aware and voiced understanding to all of above.  NCM called [facility]  [address], [place], phone [phone] , spoke with [name] .   Patient's PCP is Dr [name] fax [phone]. NCM was transferred to Dr Arvella Nigh LPN Velva Harman. NCM left voice mail with my direct connect information. Clinicals faxed to Dr Lorain Childes.  Expected Discharge Date:                  Expected Discharge Plan:     In-House Referral:     Discharge planning Services  CM Consult  Post Acute Care Choice:    Choice offered to:  Patient  DME Arranged:    DME Agency:     HH Arranged:    [place] Agency:     Status of Service:  In process, will continue to follow  If discussed at Long Length of Stay Meetings, dates discussed:    Additional Comments:  Marilu Favre, RN 04/25/2018, 3:36 PM

## 2018-04-25 NOTE — Progress Notes (Signed)
Patient transferring to 6N room 26.  Report called to receiving RN, Vinnie Level.

## 2018-04-26 LAB — CBC
HEMATOCRIT: 24.8 % — AB (ref 39.0–52.0)
Hemoglobin: 8.7 g/dL — ABNORMAL LOW (ref 13.0–17.0)
MCH: 31.2 pg (ref 26.0–34.0)
MCHC: 35.1 g/dL (ref 30.0–36.0)
MCV: 88.9 fL (ref 80.0–100.0)
Platelets: 217 10*3/uL (ref 150–400)
RBC: 2.79 MIL/uL — ABNORMAL LOW (ref 4.22–5.81)
RDW: 14 % (ref 11.5–15.5)
WBC: 4 10*3/uL (ref 4.0–10.5)
nRBC: 0 % (ref 0.0–0.2)

## 2018-04-26 LAB — GLUCOSE, CAPILLARY
GLUCOSE-CAPILLARY: 161 mg/dL — AB (ref 70–99)
GLUCOSE-CAPILLARY: 123 mg/dL — AB (ref 70–99)
Glucose-Capillary: 106 mg/dL — ABNORMAL HIGH (ref 70–99)
Glucose-Capillary: 180 mg/dL — ABNORMAL HIGH (ref 70–99)
Glucose-Capillary: 182 mg/dL — ABNORMAL HIGH (ref 70–99)
Glucose-Capillary: 190 mg/dL — ABNORMAL HIGH (ref 70–99)

## 2018-04-26 LAB — BASIC METABOLIC PANEL
Anion gap: 13 (ref 5–15)
BUN: 7 mg/dL — ABNORMAL LOW (ref 8–23)
CHLORIDE: 91 mmol/L — AB (ref 98–111)
CO2: 29 mmol/L (ref 22–32)
Calcium: 8.4 mg/dL — ABNORMAL LOW (ref 8.9–10.3)
Creatinine, Ser: 0.85 mg/dL (ref 0.61–1.24)
GFR calc Af Amer: >60 mL/min (ref >60)
GFR calc non Af Amer: >60 mL/min (ref >60)
Glucose, Bld: 172 mg/dL — ABNORMAL HIGH (ref 70–99)
Potassium: 3.2 mmol/L — ABNORMAL LOW (ref 3.5–5.1)
Sodium: 133 mmol/L — ABNORMAL LOW (ref 135–145)

## 2018-04-26 MED ORDER — POTASSIUM CHLORIDE 20 MEQ PO PACK
40.0000 meq | PACK | Freq: Once | ORAL | Status: AC
  Administered 2018-04-26: 40 meq
  Filled 2018-04-26 (×2): qty 2

## 2018-04-26 MED ORDER — HYDROMORPHONE HCL 1 MG/ML IJ SOLN
1.0000 mg | INTRAMUSCULAR | Status: DC | PRN
  Administered 2018-04-27 – 2018-05-08 (×28): 1 mg via INTRAVENOUS
  Filled 2018-04-26 (×29): qty 1

## 2018-04-26 MED ORDER — OXYCODONE HCL 5 MG/5ML PO SOLN
5.0000 mg | ORAL | Status: DC | PRN
  Administered 2018-04-29 – 2018-04-30 (×2): 5 mg via ORAL
  Filled 2018-04-26 (×2): qty 5

## 2018-04-26 MED ORDER — VITAL AF 1.2 CAL PO LIQD
1000.0000 mL | ORAL | Status: DC
  Administered 2018-04-26: 1000 mL
  Filled 2018-04-26 (×2): qty 1000

## 2018-04-26 NOTE — Progress Notes (Signed)
Upon shift assessment, drain tube dressing was noted to be about 3/4 saturated and sheet wet with small amount of serosanguineous fluids.  Area was cleansed and new dressing applied. MD notified.

## 2018-04-26 NOTE — Progress Notes (Signed)
[address]       [place] [address]             [phone]      4 Days Post-Op Procedure(s) (LRB): LAPAROSCOPY DIAGNOSTIC ERAS PATHWAY (N/A) TOTAL GASTRECTOMY (N/A) DISTAL PANCREATECTOMY (N/A) SPLENECTOMY (N/A) INSERTION OF JEJUNOSTOMY TUBE (Left) Subjective: Feels poorly overall, some nausea  Objective: Vital signs in last 24 hours: Temp:  [97.6 F (36.4 C)-98.6 F (37 C)] 98 F (36.7 C) (12/14 0814) Pulse Rate:  [65-118] 96 (12/14 0814) Resp:  [15-29] 20 (12/14 0814) BP: (106-140)/(64-81) 109/65 (12/14 0814) SpO2:  [88 %-100 %] 99 % (12/14 0814) Weight:  [54.1 kg] 54.1 kg (12/14 0500)  Hemodynamic parameters for last 24 hours:    Intake/Output from previous day: 12/13 0701 - 12/14 0700 In: 1035.8 [I.V.:1035.8] Out: 1515 [Urine:1075; Emesis/NG output:300; Drains:140] Intake/Output this shift: Total I/O In: 360 [P.O.:360] Out: 600 [Emesis/NG output:600]  General appearance: distracted, fatigued and no distress Heart: regular rate and rhythm Lungs: clear Abdomen: minor tenderness to palp Extremities: no edema or calf tenderness Wound: dressings- minor old bloody drainage  Lab Results: Recent Labs    04/25/18 0354 04/26/18 0459  WBC 5.0 4.0  HGB 7.5* 8.7*  HCT 22.8* 24.8*  PLT 155 217   BMET:  Recent Labs    04/25/18 0354 04/26/18 0459  NA 133* 133*  K 3.8 3.2*  CL 94* 91*  CO2 32 29  GLUCOSE 88 172*  BUN 7* 7*  CREATININE 0.63 0.85  CALCIUM 8.1* 8.4*    PT/INR: No results for input(s): LABPROT, INR in the last 72 hours. ABG    Component Value Date/Time   PHART 7.424 04/22/2018 1101   HCO3 24.2 04/22/2018 1101   TCO2 25 04/22/2018 1101   ACIDBASEDEF 1.0 04/22/2018 1101   O2SAT 100.0 04/22/2018 1101   CBG (last 3)  Recent Labs    04/26/18 0439 04/26/18 0815 04/26/18 1200  GLUCAP 161* 123* 180*    Meds Scheduled Meds: . enoxaparin (LOVENOX) injection  40 mg Subcutaneous Q24H  . feeding supplement  (VITAL AF 1.2 CAL)  1,000 mL Per Tube Q24H  . insulin aspart  0-9 Units Subcutaneous Q4H   Continuous Infusions: . dextrose 5 % and 0.45% NaCl 10 mL/hr at 04/26/18 1149  . methocarbamol (ROBAXIN) IV     PRN Meds:.hydrALAZINE, HYDROmorphone (DILAUDID) injection, methocarbamol (ROBAXIN) IV, oxyCODONE, prochlorperazine **OR** prochlorperazine  Xrays Dg Ugi W/water Sol Cm  Result Date: 04/25/2018 CLINICAL DATA:  Status post gastrectomy. EXAM: WATER SOLUBLE UPPER GI SERIES TECHNIQUE: Single-column upper GI series was performed using water soluble contrast. CONTRAST:  100 mL of Omnipaque 300 COMPARISON:  None. FLUOROSCOPY TIME:  Fluoroscopy Time:  1 minutes 36 seconds Number of Acquired Spot Images: 0 FINDINGS: There is mild narrowing at the anastomosis between the distal esophagus and jejunum, likely postoperative edema. However, contrast does pass freely from the esophagus into the small bowel without evidence of leak. Contrast passes into the small bowel more distally with no evidence of bowel obstruction. IMPRESSION: 1. There is narrowing at the anastomosis between the esophagus and jejunum, probably postoperative edema. That being said, the contrast passes easily from the esophagus into the small bowel and there is no evidence of small bowel obstruction and no evidence of leak. Electronically Signed   By: Dorise Bullion III M.D   On: 04/25/2018 10:30    Assessment/Plan: S/P Procedure(s) (LRB): LAPAROSCOPY DIAGNOSTIC ERAS PATHWAY (N/A) TOTAL GASTRECTOMY (N/A) DISTAL PANCREATECTOMY (  N/A) SPLENECTOMY (N/A) INSERTION OF JEJUNOSTOMY TUBE (Left)  1 hemodyn stable , sats good on RA 2 swallow study results noted- diet advancement per Gen surgery, conts TF's.   Remains  weak and cachectic.  3 replace K+, renal fxn stable in normal range 4 anemia improving trend 5 push pulm toilet/rehab as able, PT consult ordered  LOS: 4 days    John Giovanni PA-C 04/26/2018 Pager [phone]

## 2018-04-26 NOTE — Progress Notes (Signed)
  Progress Note: General Surgery Service   Assessment/Plan: Active Problems:   S/P total gastrectomy and Roux-en-Y esophagojejunal anastomosis   Protein-calorie malnutrition, severe  s/p Procedure(s): LAPAROSCOPY DIAGNOSTIC ERAS PATHWAY TOTAL GASTRECTOMY DISTAL PANCREATECTOMY SPLENECTOMY INSERTION OF JEJUNOSTOMY TUBE 04/22/2018 -stop PCA -add oxy liquid and dilaudid -increase TF to 64ml/h -continue liquids -pt eval    LOS: 4 days  Chief Complaint/Subjective: Pain moderate, vomited 1x overnight, tolerating some liquids  Objective: Vital signs in last 24 hours: Temp:  [97.6 F (36.4 C)-98.6 F (37 C)] 98 F (36.7 C) (12/14 0814) Pulse Rate:  [65-118] 96 (12/14 0814) Resp:  [15-29] 20 (12/14 0814) BP: (101-140)/(64-81) 109/65 (12/14 0814) SpO2:  [88 %-100 %] 99 % (12/14 0814) Weight:  [54.1 kg] 54.1 kg (12/14 0500) Last BM Date: 04/15/18(per patient report last tuesday)  Intake/Output from previous day: 12/13 0701 - 12/14 0700 In: 1035.8 [I.V.:1035.8] Out: 1515 [Urine:1075; Emesis/NG output:300; Drains:140] Intake/Output this shift: Total I/O In: 360 [P.O.:360] Out: -   Lungs: nonlabored breathing  Cardiovascular: RRR  Abd: soft, ATTP, incision dry, j tube in place and functionqal  Extremities: no edema  Neuro: AOx4  Lab Results: CBC  Recent Labs    04/25/18 0354 04/26/18 0459  WBC 5.0 4.0  HGB 7.5* 8.7*  HCT 22.8* 24.8*  PLT 155 217   BMET Recent Labs    04/25/18 0354 04/26/18 0459  NA 133* 133*  K 3.8 3.2*  CL 94* 91*  CO2 32 29  GLUCOSE 88 172*  BUN 7* 7*  CREATININE 0.63 0.85  CALCIUM 8.1* 8.4*   PT/INR No results for input(s): LABPROT, INR in the last 72 hours. ABG No results for input(s): PHART, HCO3 in the last 72 hours.  Invalid input(s): PCO2, PO2  Studies/Results:  Anti-infectives: Anti-infectives (From admission, onward)   Start     Dose/Rate Route Frequency Ordered Stop   04/22/18 2200  ceFAZolin (ANCEF) IVPB  2g/100 mL premix     2 g 200 mL/hr over 30 Minutes Intravenous Every 8 hours 04/22/18 1935 04/22/18 2214   04/22/18 0600  ceFAZolin (ANCEF) IVPB 2g/100 mL premix     2 g 200 mL/hr over 30 Minutes Intravenous On call to O.R. 04/22/18 0544 04/22/18 1208      Medications: Scheduled Meds: . enoxaparin (LOVENOX) injection  40 mg Subcutaneous Q24H  . feeding supplement (VITAL AF 1.2 CAL)  1,000 mL Per Tube Q24H  . insulin aspart  0-9 Units Subcutaneous Q4H   Continuous Infusions: . dextrose 5 % and 0.45% NaCl 40 mL/hr at 04/26/18 0049  . methocarbamol (ROBAXIN) IV     PRN Meds:.hydrALAZINE, HYDROmorphone (DILAUDID) injection, methocarbamol (ROBAXIN) IV, oxyCODONE, prochlorperazine **OR** prochlorperazine  Mickeal Skinner, MD [facility]

## 2018-04-26 NOTE — Progress Notes (Signed)
20 mL of Dilaudid from PCA pump wasted with Gypsy Lore

## 2018-04-26 NOTE — Plan of Care (Signed)
  Problem: Education: Goal: Knowledge of General Education information will improve Description Including pain rating scale, medication(s)/side effects and non-pharmacologic comfort measures Outcome: Progressing   Problem: Health Behavior/Discharge Planning: Goal: Ability to manage health-related needs will improve Outcome: Progressing   Problem: Clinical Measurements: Goal: Ability to maintain clinical measurements within normal limits will improve Outcome: Progressing Goal: Will remain free from infection Outcome: Progressing   Problem: Activity: Goal: Risk for activity intolerance will decrease Outcome: Progressing   Problem: Nutrition: Goal: Adequate nutrition will be maintained Outcome: Progressing   Problem: Coping: Goal: Level of anxiety will decrease Outcome: Progressing   Problem: Pain Managment: Goal: General experience of comfort will improve Outcome: Progressing   Problem: Skin Integrity: Goal: Risk for impaired skin integrity will decrease Outcome: Progressing

## 2018-04-27 LAB — TYPE AND SCREEN
ABO/RH(D): O POS
Antibody Screen: NEGATIVE
UNIT DIVISION: 0
Unit division: 0
Unit division: 0
Unit division: 0

## 2018-04-27 LAB — BASIC METABOLIC PANEL
Anion gap: 13 (ref 5–15)
BUN: 13 mg/dL (ref 8–23)
CO2: 30 mmol/L (ref 22–32)
Calcium: 8.3 mg/dL — ABNORMAL LOW (ref 8.9–10.3)
Chloride: 94 mmol/L — ABNORMAL LOW (ref 98–111)
Creatinine, Ser: 0.76 mg/dL (ref 0.61–1.24)
GFR calc Af Amer: >60 mL/min (ref >60)
GFR calc non Af Amer: >60 mL/min (ref >60)
Glucose, Bld: 175 mg/dL — ABNORMAL HIGH (ref 70–99)
Potassium: 3.2 mmol/L — ABNORMAL LOW (ref 3.5–5.1)
Sodium: 137 mmol/L (ref 135–145)

## 2018-04-27 LAB — BPAM RBC
Blood Product Expiration Date: 202001052359
Blood Product Expiration Date: 202001052359
Blood Product Expiration Date: 202001082359
Blood Product Expiration Date: 202001082359
ISSUE DATE / TIME: 201912100957
ISSUE DATE / TIME: 201912111126
Unit Type and Rh: 5100
Unit Type and Rh: 5100
Unit Type and Rh: 5100
Unit Type and Rh: 5100

## 2018-04-27 LAB — GLUCOSE, CAPILLARY
GLUCOSE-CAPILLARY: 169 mg/dL — AB (ref 70–99)
Glucose-Capillary: 149 mg/dL — ABNORMAL HIGH (ref 70–99)
Glucose-Capillary: 164 mg/dL — ABNORMAL HIGH (ref 70–99)
Glucose-Capillary: 170 mg/dL — ABNORMAL HIGH (ref 70–99)

## 2018-04-27 LAB — CBC
HCT: 22.2 % — ABNORMAL LOW (ref 39.0–52.0)
Hemoglobin: 7.8 g/dL — ABNORMAL LOW (ref 13.0–17.0)
MCH: 31.1 pg (ref 26.0–34.0)
MCHC: 35.1 g/dL (ref 30.0–36.0)
MCV: 88.4 fL (ref 80.0–100.0)
Platelets: 238 10*3/uL (ref 150–400)
RBC: 2.51 MIL/uL — ABNORMAL LOW (ref 4.22–5.81)
RDW: 14.2 % (ref 11.5–15.5)
WBC: 7.4 10*3/uL (ref 4.0–10.5)
nRBC: 0 % (ref 0.0–0.2)

## 2018-04-27 MED ORDER — POTASSIUM CHLORIDE 10 MEQ/100ML IV SOLN
10.0000 meq | INTRAVENOUS | Status: AC
  Administered 2018-04-27 (×6): 10 meq via INTRAVENOUS
  Filled 2018-04-27 (×6): qty 100

## 2018-04-27 MED ORDER — VITAL AF 1.2 CAL PO LIQD
1000.0000 mL | ORAL | Status: DC
  Administered 2018-04-27 – 2018-04-28 (×2): 1000 mL
  Filled 2018-04-27 (×2): qty 1000

## 2018-04-27 MED ORDER — DIPHENHYDRAMINE HCL 50 MG/ML IJ SOLN
12.5000 mg | Freq: Four times a day (QID) | INTRAMUSCULAR | Status: DC | PRN
  Administered 2018-04-29: 25 mg via INTRAVENOUS
  Administered 2018-04-30: 12.5 mg via INTRAVENOUS
  Administered 2018-05-01: 25 mg via INTRAVENOUS
  Filled 2018-04-27 (×3): qty 1

## 2018-04-27 MED ORDER — ONDANSETRON HCL 4 MG/2ML IJ SOLN
4.0000 mg | Freq: Four times a day (QID) | INTRAMUSCULAR | Status: DC | PRN
  Administered 2018-04-27 – 2018-04-30 (×6): 4 mg via INTRAVENOUS
  Filled 2018-04-27 (×5): qty 2

## 2018-04-27 MED ORDER — ACETAMINOPHEN 325 MG PO TABS
325.0000 mg | ORAL_TABLET | Freq: Four times a day (QID) | ORAL | Status: DC | PRN
  Administered 2018-04-29: 650 mg via ORAL
  Filled 2018-04-27: qty 2

## 2018-04-27 MED ORDER — BISACODYL 10 MG RE SUPP
10.0000 mg | Freq: Once | RECTAL | Status: AC
  Administered 2018-04-27: 10 mg via RECTAL
  Filled 2018-04-27: qty 1

## 2018-04-27 NOTE — Evaluation (Signed)
Physical Therapy Evaluation Patient Details Name: Nathan Massey MRN: [id number] DOB: Nov 13, 1950 Today's Date: 04/27/2018   History of Present Illness   Patient is a 67 year old male referred by Dr. Servando Snare for diagnosis of adenocarcinoma of the GE junction. 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 Independent prior to admission; presents with decr functional mobility, decr activity tolerance; I'm concerned that he doesn't have a lot of assist at home; put in OT consult to help with ADLs;  Patient will benefit from skilled PT to increase their independence and safety with mobility to allow discharge to the venue listed below.       Follow Up Recommendations Home health PT;Supervision - Intermittent    Equipment Recommendations  Rolling walker with 5" wheels;3in1 (PT)    Recommendations for Other Services OT consult(ordered per protocol)     Precautions / Restrictions Precautions Precaution Comments: Abdominal surgery      Mobility  Bed Mobility                  Transfers Overall transfer level: Needs assistance Equipment used: Rolling walker (2 wheeled) Transfers: Sit to/from Stand Sit to Stand: Min assist         General transfer comment: Min assist to steady; dependent on UEs to push off and steady  Ambulation/Gait Ambulation/Gait assistance: Min guard Gait Distance (Feet): 45 Feet Assistive device: Rolling walker (2 wheeled) Gait Pattern/deviations: Decreased step length - right;Decreased step length - left;Decreased stride length Gait velocity:  slwoed   General Gait Details: Cues to self-monitor for activity tolerance  Stairs            Wheelchair Mobility    Modified Rankin (Stroke Patients Only)       Balance Overall balance assessment: Needs assistance           Standing balance-Leahy Scale: Poor                               Pertinent Vitals/Pain Pain Assessment: Faces Faces Pain Scale: Hurts even more Pain Location: abdomen Pain Descriptors / Indicators: Aching;Grimacing;Guarding Pain Intervention(s): Monitored during session    Home Living Family/patient expects to be discharged to:: Private residence Living Arrangements: Alone Available Help at Discharge: Family;Friend(s);Available PRN/intermittently Type of Home: House Home Access: Stairs to enter Entrance Stairs-Rails: None Entrance Stairs-Number of Steps: 2 Home Layout: One level        Prior Function Level of Independence: Independent               Hand Dominance        Extremity/Trunk Assessment   Upper Extremity Assessment Upper Extremity Assessment: Generalized weakness    Lower Extremity Assessment Lower Extremity Assessment: Generalized weakness       Communication   Communication: No difficulties  Cognition Arousal/Alertness: Awake/alert Behavior During Therapy: WFL for tasks assessed/performed Overall Cognitive Status: Within Functional Limits for tasks assessed  General Comments      Exercises     Assessment/Plan    PT Assessment Patient needs continued PT services  PT Problem List Decreased strength;Decreased range of motion;Decreased activity tolerance;Decreased balance;Decreased mobility;Decreased coordination;Decreased cognition;Decreased knowledge of use of DME;Decreased safety awareness;Decreased knowledge of precautions       PT Treatment Interventions DME instruction;Gait training;Stair training;Functional mobility  training;Therapeutic activities;Therapeutic exercise;Balance training;Patient/family education    PT Goals (Current goals can be found in the Care Plan section)  Acute Rehab PT Goals Patient Stated Goal: did not state PT Goal Formulation: With patient Time For Goal Achievement: 05/11/18 Potential to Achieve Goals: Good    Frequency Min 3X/week   Barriers to discharge Decreased caregiver support      Co-evaluation               AM-PAC PT "6 Clicks" Mobility  Outcome Measure Help needed turning from your back to your side while in a flat bed without using bedrails?: A Little Help needed moving from lying on your back to sitting on the side of a flat bed without using bedrails?: A Lot Help needed moving to and from a bed to a chair (including a wheelchair)?: A Little Help needed standing up from a chair using your arms (e.g., wheelchair or bedside chair)?: A Little Help needed to walk in hospital room?: A Little Help needed climbing 3-5 steps with a railing? : A Lot 6 Click Score: 16    End of Session   Activity Tolerance: Patient tolerated treatment well Patient left: in chair;with call bell/phone within reach Nurse Communication: Mobility status PT Visit Diagnosis: Unsteadiness on feet (R26.81);Other abnormalities of gait and mobility (R26.89);Pain Pain - part of body: (abdomen)    Time: 1439-1510(minus approx 10 minutes while pt was in bathroom) PT Time Calculation (min) (ACUTE ONLY): 31 min   Charges:   PT Evaluation $PT Eval Moderate Complexity: [place], [place]  Acute Rehabilitation Services Pager [phone] Office [place] 04/27/2018, 5:08 PM

## 2018-04-27 NOTE — Progress Notes (Signed)
  Progress Note: General Surgery Service   Assessment/Plan: Active Problems:   S/P total gastrectomy and Roux-en-Y esophagojejunal anastomosis   Protein-calorie malnutrition, severe  s/p Procedure(s): LAPAROSCOPY DIAGNOSTIC ERAS PATHWAY TOTAL GASTRECTOMY DISTAL PANCREATECTOMY SPLENECTOMY INSERTION OF JEJUNOSTOMY TUBE 04/22/2018 -nausea/vomiting looks all bilious rather than tube feeds.  Increase antiemetics.   -oxy liquid and prn dilaudid -increase TF to 60ml/hr -continue clear liquids as tolerated. -pt eval  Hold lovenox as HCT continues to drop.  May end up needing transfusion.     LOS: 5 days  Chief Complaint/Subjective: Threw up quite a bit yesterday.  Objective: Vital signs in last 24 hours: Temp:  [98 F (36.7 C)-99.2 F (37.3 C)] 98.4 F (36.9 C) (12/15 0422) Pulse Rate:  [80-89] 80 (12/15 0422) Resp:  [16-24] 16 (12/15 0422) BP: (122-133)/(73-84) 126/73 (12/15 0422) SpO2:  [95 %-99 %] 98 % (12/15 0422) Last BM Date: 04/15/18(per patient report last tuesday)  Intake/Output from previous day: 12/14 0701 - 12/15 0700 In: 700 [P.O.:360; NG/GT:240] Out: 1950 [Urine:1150; Emesis/NG output:700; Drains:100] Intake/Output this shift: No intake/output data recorded.  General- looks uncomfortable.  Has emesis bag with bile in it next to bed.    Lungs: nonlabored breathing  Cardiovascular: RRR  Abd: soft, incision c/d/i.  Drain serosang.  Leave for now as it is behind anastamosis as well as being near pancreatic stump.    Extremities: no edema  Neuro: AOx4  Lab Results: CBC  Recent Labs    04/26/18 0459 04/27/18 0237  WBC 4.0 7.4  HGB 8.7* 7.8*  HCT 24.8* 22.2*  PLT 217 238   BMET Recent Labs    04/26/18 0459 04/27/18 0237  NA 133* 137  K 3.2* 3.2*  CL 91* 94*  CO2 29 30  GLUCOSE 172* 175*  BUN 7* 13  CREATININE 0.85 0.76  CALCIUM 8.4* 8.3*   PT/INR No results for input(s): LABPROT, INR in the last 72 hours. ABG No results for input(s):  PHART, HCO3 in the last 72 hours.  Invalid input(s): PCO2, PO2  Studies/Results:  Anti-infectives: Anti-infectives (From admission, onward)   Start     Dose/Rate Route Frequency Ordered Stop   04/22/18 2200  ceFAZolin (ANCEF) IVPB 2g/100 mL premix     2 g 200 mL/hr over 30 Minutes Intravenous Every 8 hours 04/22/18 1935 04/22/18 2214   04/22/18 0600  ceFAZolin (ANCEF) IVPB 2g/100 mL premix     2 g 200 mL/hr over 30 Minutes Intravenous On call to O.R. 04/22/18 0544 04/22/18 1208      Medications: Scheduled Meds: . bisacodyl  10 mg Rectal Once  . feeding supplement (VITAL AF 1.2 CAL)  1,000 mL Per Tube Q24H  . insulin aspart  0-9 Units Subcutaneous Q4H   Continuous Infusions: . dextrose 5 % and 0.45% NaCl 10 mL/hr at 04/26/18 1149  . methocarbamol (ROBAXIN) IV    . potassium chloride     PRN Meds:.acetaminophen, diphenhydrAMINE, hydrALAZINE, HYDROmorphone (DILAUDID) injection, methocarbamol (ROBAXIN) IV, ondansetron (ZOFRAN) IV, oxyCODONE, prochlorperazine **OR** prochlorperazine  Stark Klein, MD [facility]

## 2018-04-27 NOTE — Plan of Care (Signed)
  Problem: Clinical Measurements: °Goal: Respiratory complications will improve °Outcome: Progressing °  °Problem: Activity: °Goal: Risk for activity intolerance will decrease °Outcome: Progressing °  °Problem: Nutrition: °Goal: Adequate nutrition will be maintained °Outcome: Progressing °  °Problem: Elimination: °Goal: Will not experience complications related to bowel motility °Outcome: Progressing °  °Problem: Pain Managment: °Goal: General experience of comfort will improve °Outcome: Progressing °  °

## 2018-04-28 LAB — BASIC METABOLIC PANEL
Anion gap: 13 (ref 5–15)
BUN: 19 mg/dL (ref 8–23)
CO2: 25 mmol/L (ref 22–32)
Calcium: 8.4 mg/dL — ABNORMAL LOW (ref 8.9–10.3)
Chloride: 99 mmol/L (ref 98–111)
Creatinine, Ser: 0.77 mg/dL (ref 0.61–1.24)
GFR calc Af Amer: >60 mL/min (ref >60)
GFR calc non Af Amer: >60 mL/min (ref >60)
Glucose, Bld: 183 mg/dL — ABNORMAL HIGH (ref 70–99)
Potassium: 4 mmol/L (ref 3.5–5.1)
Sodium: 137 mmol/L (ref 135–145)

## 2018-04-28 LAB — CBC
HCT: 24 % — ABNORMAL LOW (ref 39.0–52.0)
Hemoglobin: 8 g/dL — ABNORMAL LOW (ref 13.0–17.0)
MCH: 30.7 pg (ref 26.0–34.0)
MCHC: 33.3 g/dL (ref 30.0–36.0)
MCV: 92 fL (ref 80.0–100.0)
Platelets: 295 10*3/uL (ref 150–400)
RBC: 2.61 MIL/uL — ABNORMAL LOW (ref 4.22–5.81)
RDW: 14.6 % (ref 11.5–15.5)
WBC: 7.9 10*3/uL (ref 4.0–10.5)
nRBC: 0 % (ref 0.0–0.2)

## 2018-04-28 LAB — GLUCOSE, CAPILLARY
Glucose-Capillary: 119 mg/dL — ABNORMAL HIGH (ref 70–99)
Glucose-Capillary: 133 mg/dL — ABNORMAL HIGH (ref 70–99)
Glucose-Capillary: 135 mg/dL — ABNORMAL HIGH (ref 70–99)
Glucose-Capillary: 143 mg/dL — ABNORMAL HIGH (ref 70–99)
Glucose-Capillary: 149 mg/dL — ABNORMAL HIGH (ref 70–99)
Glucose-Capillary: 170 mg/dL — ABNORMAL HIGH (ref 70–99)
Glucose-Capillary: 183 mg/dL — ABNORMAL HIGH (ref 70–99)
Glucose-Capillary: 195 mg/dL — ABNORMAL HIGH (ref 70–99)

## 2018-04-28 MED ORDER — VITAL AF 1.2 CAL PO LIQD
1000.0000 mL | ORAL | Status: DC
  Administered 2018-04-28 – 2018-05-07 (×12): 1000 mL
  Filled 2018-04-28 (×15): qty 1000

## 2018-04-28 NOTE — Progress Notes (Signed)
Nutrition Follow-up  DOCUMENTATION CODES:   Severe malnutrition in context of chronic illness  INTERVENTION:   -Continue Vital AF 1.2 @ 55 ml/hr via j-tube  Tube feeding regimen provides 1584 kcal (100% of needs), 99 grams of protein, and 1070 ml of H2O.   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  The patient presented to the [place] in [place] in February 2019 with dysphasia.  Work-up showed a 9 cm mass at the GE junction.  12/10- s/p TOTAL GASTRECTOMY ; DISTAL PANCREATECTOMY; SPLENECTOMY; INSERTION OF JEJUNOSTOMY TUBE  12/11- trickle feeds initiated at 10 ml/hr 12/12- NGT d/c 12/13- transferred to surgical floor 12/16- TF advanced to goal rate of 55 ml/hr, advanced to full liquid diet  Reviewed I/O's: -160 ml x 24 hours and +2.6 L since admission  Drains: 200 ml output x 24 hours  Spoke with pt, who complains of pain at time of visit. He reports he does not know if he is feeling better, "but everyone says I am". Pt reports he has had stomach pain and some vomiting and has not had any PO intake because of it. He has not complaints about his tube feedings and has been tolerating well since initiation.   Case discussed with RN, who [facility].   Labs reviewed: CBGS: 119-183 (inpatient orders for glycemic control are 0-9 units insulin aspart every 4 hours).   Diet Order:   Diet Order            Diet full liquid  Room service appropriate? Yes; Fluid consistency: Thin  Diet effective now              EDUCATION NEEDS:   No education needs have been identified at this time  Skin:  Skin Assessment: Skin Integrity Issues: Skin Integrity Issues:: Incisions Incisions: surgical; abdominal  Last BM:  04/28/18  Height:   Ht Readings from Last 1 Encounters:  04/25/18 '5\' 6"'  (1.676 m)    Weight:   Wt Readings from Last 1 Encounters:  04/26/18 54.1 kg    Ideal Body Weight:  64.5 kg  BMI:  Body mass index is 19.25 kg/m.  Estimated Nutritional Needs:   Kcal:  1600-1800  Protein:  90-105 gm  Fluid:  1.6-1.8 L    Iori Gigante A. Jimmye Norman, RD, LDN, CDE Pager: [phone] After hours Pager: [phone]

## 2018-04-28 NOTE — Plan of Care (Signed)
  Problem: Nutrition: Goal: Adequate nutrition will be maintained Outcome: Not Progressing   Problem: Pain Managment: Goal: General experience of comfort will improve Outcome: Progressing   Problem: Clinical Measurements: Goal: Ability to maintain clinical measurements within normal limits will improve Outcome: Progressing

## 2018-04-28 NOTE — Progress Notes (Signed)
  Progress Note: General Surgery Service   Assessment/Plan: Active Problems:   S/P total gastrectomy and Roux-en-Y esophagojejunal anastomosis   Protein-calorie malnutrition, severe  s/p Procedure(s): LAPAROSCOPY DIAGNOSTIC ERAS PATHWAY TOTAL GASTRECTOMY DISTAL PANCREATECTOMY SPLENECTOMY INSERTION OF JEJUNOSTOMY TUBE 04/22/2018   Tube feeds to goal today.  Will let pt try full liquids.    -oxy liquid and prn dilaudid -pt eval  ABL anemia - Hold lovenox as HCT continues to drop.  May end up needing transfusion.   Insulin for DM.     LOS: 6 days  Chief Complaint/Subjective: Less nausea.  Had BM.    Objective: Vital signs in last 24 hours: Temp:  [97.7 F (36.5 C)-98.6 F (37 C)] 98.2 F (36.8 C) (12/16 0335) Pulse Rate:  [74-85] 79 (12/16 0335) Resp:  [18-22] 20 (12/16 0335) BP: (122-146)/(67-77) 122/67 (12/16 0335) SpO2:  [91 %-99 %] 91 % (12/16 0335) Last BM Date: 04/28/18  Intake/Output from previous day: 12/15 0701 - 12/16 0700 In: 440 [IV Piggyback:400] Out: 600 [Urine:400; Drains:200] Intake/Output this shift: No intake/output data recorded.  General- looks a little better today.  OOB in recliner  Lungs: nonlabored breathing  Cardiovascular: RRR  Abd: soft, incision c/d/i.  Drain serosang.  Leave for now as it is behind anastamosis as well as being near pancreatic stump.    Extremities: no edema  Neuro: AOx4  Lab Results: CBC  Recent Labs    04/27/18 0237 04/28/18 0606  WBC 7.4 7.9  HGB 7.8* 8.0*  HCT 22.2* 24.0*  PLT 238 295   BMET Recent Labs    04/27/18 0237 04/28/18 0606  NA 137 137  K 3.2* 4.0  CL 94* 99  CO2 30 25  GLUCOSE 175* 183*  BUN 13 19  CREATININE 0.76 0.77  CALCIUM 8.3* 8.4*   PT/INR No results for input(s): LABPROT, INR in the last 72 hours. ABG No results for input(s): PHART, HCO3 in the last 72 hours.  Invalid input(s): PCO2, PO2  Studies/Results:  Anti-infectives: Anti-infectives (From admission,  onward)   Start     Dose/Rate Route Frequency Ordered Stop   04/22/18 2200  ceFAZolin (ANCEF) IVPB 2g/100 mL premix     2 g 200 mL/hr over 30 Minutes Intravenous Every 8 hours 04/22/18 1935 04/22/18 2214   04/22/18 0600  ceFAZolin (ANCEF) IVPB 2g/100 mL premix     2 g 200 mL/hr over 30 Minutes Intravenous On call to O.R. 04/22/18 0544 04/22/18 1208      Medications: Scheduled Meds: . feeding supplement (VITAL AF 1.2 CAL)  1,000 mL Per Tube Q24H  . insulin aspart  0-9 Units Subcutaneous Q4H   Continuous Infusions: . dextrose 5 % and 0.45% NaCl 10 mL/hr at 04/27/18 1930  . methocarbamol (ROBAXIN) IV     PRN Meds:.acetaminophen, diphenhydrAMINE, hydrALAZINE, HYDROmorphone (DILAUDID) injection, methocarbamol (ROBAXIN) IV, ondansetron (ZOFRAN) IV, oxyCODONE, prochlorperazine **OR** prochlorperazine  Stark Klein, MD [facility]

## 2018-04-28 NOTE — Care Management (Signed)
[facility] clinic [phone] was transferred to [facility] for [name] NP, left voicemail requesting call back , patient approaching discharge and has home needs including tube feeds.  Have not heard back from [place] who I was directed to on Friday. Today was instructed to fax clinicals to [phone]. Same done.   [name] RN BSN [phone]

## 2018-04-28 NOTE — Evaluation (Signed)
Occupational Therapy Evaluation Patient Details Name: Nathan Massey MRN: [id number] DOB: 26-Jan-1951 Today's Date: 04/28/2018    History of Present Illness  Patient is a 67 year old male referred by Dr. Servando Snare for diagnosis of adenocarcinoma of the GE junction. 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 At baseline pt is independent with ADLs and functional mobility. Pt presents up in chair agreeable to working with OT this session. Pt mostly limited due to pain, requiring minA for room level functional mobility using RW. He currently requires minguard assist for seated UB ADL, mod-maxA for LB ADLs. Pt will benefit from continued acute OT services and recommend follow up [place] therapy services after discharge to maximize his overall safety and independence with ADLs and mobility. Will follow.     Follow Up Recommendations  Home health OT;Supervision - Intermittent    Equipment Recommendations  3 in 1 bedside commode           Precautions / Restrictions Precautions Precautions: Fall Precaution Comments: Abdominal surgery Restrictions Weight Bearing Restrictions: No      Mobility Bed Mobility Overal bed mobility: Needs Assistance Bed Mobility: Sit to Sidelying;Rolling Rolling: Supervision       Sit to sidelying: Min assist General bed mobility comments: light minA for LEs onto EOB; educated on use of log roll to decrease strain on abdominal region and for pain management  Transfers Overall transfer level: Needs assistance Equipment used: Rolling walker (2 wheeled) Transfers: Sit to/from Stand Sit to Stand: Min assist         General  transfer comment: multimodal cues for hand placement; assist to rise and steady at RW    Balance Overall balance assessment: Needs assistance Sitting-balance support: Feet supported Sitting balance-Leahy Scale: Good     Standing balance support: Bilateral upper extremity supported Standing balance-Leahy Scale: Poor Standing balance comment: reliant on UE support                           ADL either performed or assessed with clinical judgement   ADL Overall ADL's : Needs assistance/impaired Eating/Feeding: Modified independent;Sitting   Grooming: Set up;Sitting   Upper Body Bathing: Min guard;Sitting   Lower Body Bathing: Sit to/from stand;Moderate assistance Lower Body Bathing Details (indicate cue type and reason): minA standing balance Upper Body Dressing : Set up;Min guard;Sitting   Lower Body Dressing: Moderate assistance;Maximal assistance;Sit to/from stand Lower Body Dressing Details (indicate cue type and reason): minA standing balance, difficulty reaching LEs due to pain Toilet Transfer: Minimal assistance;Ambulation;RW Toilet Transfer Details (indicate cue type and reason): simulated in room level mobility, transfer to EOB from recliner Toileting- Clothing Manipulation and Hygiene: Minimal assistance;Sit to/from stand       Functional mobility during ADLs: Minimal assistance;Rolling walker General ADL Comments: pt mostly limited due to pain     Vision         Perception     Praxis      Pertinent Vitals/Pain Pain Assessment: 0-10 Pain Score: 10-Worst pain ever Pain Location: abdomen Pain Descriptors / Indicators: Aching;Grimacing;Guarding Pain Intervention(s): Monitored during session;Repositioned;Limited activity within patient's tolerance     Hand Dominance     Extremity/Trunk Assessment Upper Extremity Assessment Upper Extremity  Assessment: Generalized weakness   Lower Extremity Assessment Lower Extremity Assessment: Defer to PT  evaluation       Communication Communication Communication: No difficulties   Cognition Arousal/Alertness: Awake/alert Behavior During Therapy: Flat affect;WFL for tasks assessed/performed Overall Cognitive Status: Within Functional Limits for tasks assessed                                     General Comments       Exercises     Shoulder Instructions      Home Living Family/patient expects to be discharged to:: Private residence Living Arrangements: Spouse/significant other Available Help at Discharge: Friend(s);Family;Available PRN/intermittently Type of Home: House Home Access: Stairs to enter [employer] of Steps: 2 Entrance Stairs-Rails: None Home Layout: One level     Bathroom Shower/Tub: Tub/shower unit;Walk-in shower   Bathroom Toilet: Standard     Home Equipment: None          Prior Functioning/Environment Level of Independence: Independent                 OT Problem List: Decreased strength;Decreased range of motion;Decreased activity tolerance;Impaired balance (sitting and/or standing);Decreased knowledge of use of DME or AE;Pain      OT Treatment/Interventions: Therapeutic exercise;Self-care/ADL training;DME and/or AE instruction;Therapeutic activities;Patient/family education;Balance training    OT Goals(Current goals can be found in the care plan section) Acute Rehab OT Goals Patient Stated Goal: less pain OT Goal Formulation: With patient Time For Goal Achievement: 05/12/18 Potential to Achieve Goals: Good  OT Frequency: Min 2X/week   Barriers to D/C:            Co-evaluation              AM-PAC OT "6 Clicks" Daily Activity     Outcome Measure Help from another person eating meals?: None Help from another person taking care of personal grooming?: A Little Help from another person toileting, which includes using toliet, bedpan, or urinal?: A Little Help from another person bathing (including  washing, rinsing, drying)?: A Little Help from another person to put on and taking off regular upper body clothing?: None Help from another person to put on and taking off regular lower body clothing?: A Lot 6 Click Score: 19   End of Session Equipment Utilized During Treatment: Gait belt;Rolling walker Nurse Communication: Mobility status  Activity Tolerance: Patient tolerated treatment well;Patient limited by pain Patient left: in bed;with call bell/phone within reach;with bed alarm set  OT Visit Diagnosis: Other abnormalities of gait and mobility (R26.89);Muscle weakness (generalized) (M62.81);Pain Pain - part of body: (abdomen)                Time: 3532-9924 OT Time Calculation (min): 20 min Charges:  OT General Charges $OT Visit: 1 Visit OT Evaluation $OT Eval Moderate Complexity: [place], OT [employer] Pager [phone] Office [phone]   Raymondo Band 04/28/2018, 9:14 AM

## 2018-04-29 ENCOUNTER — Inpatient Hospital Stay (HOSPITAL_COMMUNITY): Payer: No Typology Code available for payment source

## 2018-04-29 LAB — GLUCOSE, CAPILLARY
GLUCOSE-CAPILLARY: 163 mg/dL — AB (ref 70–99)
GLUCOSE-CAPILLARY: 148 mg/dL — AB (ref 70–99)
GLUCOSE-CAPILLARY: 146 mg/dL — AB (ref 70–99)
Glucose-Capillary: 157 mg/dL — ABNORMAL HIGH (ref 70–99)
Glucose-Capillary: 173 mg/dL — ABNORMAL HIGH (ref 70–99)
Glucose-Capillary: 176 mg/dL — ABNORMAL HIGH (ref 70–99)
Glucose-Capillary: 191 mg/dL — ABNORMAL HIGH (ref 70–99)

## 2018-04-29 LAB — URINALYSIS, COMPLETE (UACMP) WITH MICROSCOPIC
BACTERIA UA: NONE SEEN
Bilirubin Urine: NEGATIVE
Glucose, UA: NEGATIVE mg/dL
Hgb urine dipstick: NEGATIVE
Ketones, ur: NEGATIVE mg/dL
Leukocytes, UA: NEGATIVE
Nitrite: NEGATIVE
Protein, ur: NEGATIVE mg/dL
Specific Gravity, Urine: 1.025 (ref 1.005–1.030)
pH: 5 (ref 5.0–8.0)

## 2018-04-29 LAB — CBC
HCT: 23.8 % — ABNORMAL LOW (ref 39.0–52.0)
Hemoglobin: 7.9 g/dL — ABNORMAL LOW (ref 13.0–17.0)
MCH: 30.9 pg (ref 26.0–34.0)
MCHC: 33.2 g/dL (ref 30.0–36.0)
MCV: 93 fL (ref 80.0–100.0)
Platelets: 332 10*3/uL (ref 150–400)
RBC: 2.56 MIL/uL — ABNORMAL LOW (ref 4.22–5.81)
RDW: 14.6 % (ref 11.5–15.5)
WBC: 17.1 10*3/uL — ABNORMAL HIGH (ref 4.0–10.5)
nRBC: 0.1 % (ref 0.0–0.2)

## 2018-04-29 LAB — BASIC METABOLIC PANEL
Anion gap: 12 (ref 5–15)
BUN: 18 mg/dL (ref 8–23)
CHLORIDE: 101 mmol/L (ref 98–111)
CO2: 26 mmol/L (ref 22–32)
Calcium: 8.6 mg/dL — ABNORMAL LOW (ref 8.9–10.3)
Creatinine, Ser: 0.72 mg/dL (ref 0.61–1.24)
GFR calc Af Amer: >60 mL/min (ref >60)
GFR calc non Af Amer: >60 mL/min (ref >60)
Glucose, Bld: 162 mg/dL — ABNORMAL HIGH (ref 70–99)
Potassium: 3.7 mmol/L (ref 3.5–5.1)
Sodium: 139 mmol/L (ref 135–145)

## 2018-04-29 NOTE — Care Management (Signed)
[name] RN for [name] NP back NCM back. Call back . Home health, tube feeding and DME orders faxed to [facility]. Ms Manson Allan aware discharge planned for this week, she will begin setting up home health and tube feeds today. [name] RN BSN [phone]

## 2018-04-29 NOTE — Progress Notes (Signed)
PT Cancellation Note  Patient Details Name: Pharrell Ledford MRN: [id number] DOB: May 09, 1951   Cancelled Treatment:    Reason Eval/Treat Not Completed: Other (comment)   Mr. Cawley politely declined PT, stating he just got comfortable and doesn't want to move;  Made my best effort at education on rationale and benefits of functional mobility;  He invited me (PT) back tomorrow;   Roney Marion, [place] Pager [phone] Office [phone]    Colletta [place] 04/29/2018, 11:59 AM

## 2018-04-29 NOTE — Progress Notes (Signed)
  Progress Note: General Surgery Service   Assessment/Plan: Active Problems:   S/P total gastrectomy and Roux-en-Y esophagojejunal anastomosis   Protein-calorie malnutrition, severe  s/p Procedure(s): LAPAROSCOPY DIAGNOSTIC ERAS PATHWAY TOTAL GASTRECTOMY DISTAL PANCREATECTOMY SPLENECTOMY INSERTION OF JEJUNOSTOMY TUBE 04/22/2018   Leukocytosis today.  Will check CXR and UA.    -oxy liquid and prn dilaudid -pt/OT eval rec intermittent supervision with HH   ABL anemia - Hold lovenox for now.  prob restart in AM if HCT up some more.  Give oxy, robaxin, and benadryl at 10 pm for sleep.  .    LOS: 7 days  Chief Complaint/Subjective: Complains of no sleep.  Less n/v, but eating less.  Tube feeds at goal.    Objective: Vital signs in last 24 hours: Temp:  [98.2 F (36.8 C)-98.8 F (37.1 C)] 98.7 F (37.1 C) (12/17 0520) Pulse Rate:  [80-96] 96 (12/17 0520) Resp:  [18-22] 19 (12/17 0520) BP: (133-141)/(81-86) 136/81 (12/17 0520) SpO2:  [96 %-98 %] 97 % (12/17 0520) Last BM Date: 04/28/18  Intake/Output from previous day: 12/16 0701 - 12/17 0700 In: 2932 [P.O.:200; I.V.:310.3; NG/GT:2421.8] Out: 590 [Urine:475; Drains:115] Intake/Output this shift: No intake/output data recorded.  General- alert and oriented.  Mild distress.    Lungs: nonlabored breathing  Cardiovascular: RRR  Abd: soft, incision c/d/i.  Drain serosang.  Leave for now as it is behind anastamosis as well as being near pancreatic stump.    Extremities: no edema  Neuro: AOx4  Lab Results: CBC  Recent Labs    04/28/18 0606 04/29/18 0226  WBC 7.9 17.1*  HGB 8.0* 7.9*  HCT 24.0* 23.8*  PLT 295 332   BMET Recent Labs    04/28/18 0606 04/29/18 0226  NA 137 139  K 4.0 3.7  CL 99 101  CO2 25 26  GLUCOSE 183* 162*  BUN 19 18  CREATININE 0.77 0.72  CALCIUM 8.4* 8.6*   PT/INR No results for input(s): LABPROT, INR in the last 72 hours. ABG No results for input(s): PHART, HCO3 in the last  72 hours.  Invalid input(s): PCO2, PO2  Studies/Results:  Anti-infectives: Anti-infectives (From admission, onward)   Start     Dose/Rate Route Frequency Ordered Stop   04/22/18 2200  ceFAZolin (ANCEF) IVPB 2g/100 mL premix     2 g 200 mL/hr over 30 Minutes Intravenous Every 8 hours 04/22/18 1935 04/22/18 2214   04/22/18 0600  ceFAZolin (ANCEF) IVPB 2g/100 mL premix     2 g 200 mL/hr over 30 Minutes Intravenous On call to O.R. 04/22/18 0544 04/22/18 1208      Medications: Scheduled Meds: . insulin aspart  0-9 Units Subcutaneous Q4H   Continuous Infusions: . dextrose 5 % and 0.45% NaCl 10 mL/hr at 04/27/18 1930  . feeding supplement (VITAL AF 1.2 CAL) 1,000 mL (04/29/18 0033)  . methocarbamol (ROBAXIN) IV     PRN Meds:.acetaminophen, diphenhydrAMINE, hydrALAZINE, HYDROmorphone (DILAUDID) injection, methocarbamol (ROBAXIN) IV, ondansetron (ZOFRAN) IV, oxyCODONE, prochlorperazine **OR** prochlorperazine  Stark Klein, MD [facility]

## 2018-04-29 NOTE — Care Management (Signed)
[place] [facility] [phone] , spoke to [name] . [name] will have [name] RN for [name] NP back NCM back. Awaiting call back. [name] RN BSN [phone]

## 2018-04-29 NOTE — Progress Notes (Signed)
Occupational Therapy Treatment Patient Details Name: Nathan Massey MRN: [id number] DOB: [birth year] Today's Date: 04/29/2018    History of present illness  Patient is a 67 year old male referred by Dr. Servando Snare for diagnosis of adenocarcinoma of the GE junction. 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 Pt performing bed mobility with MinGuard and HOB elevated and initiating log rolling technique for supine to sitting EOB. Pt sit to stand with MinGuard and ambulating with RW a community distance with MinguardA for stability with RW and to manage IV pole. Pt requiring seated rest break during mobility task. Pt reported movement made his abdomen feel better and slight facial grimacing noted with bed mobility. Pt tolerating session well. Pt continues to progress and would benefit from continued OT skilled services for [facility] for ADLs, mobility and safety.    Follow Up Recommendations  Home health OT;Supervision - Intermittent    Equipment Recommendations  3 in 1 bedside commode    Recommendations for Other Services      Precautions / Restrictions Precautions Precautions: Fall Precaution Comments: Abdominal surgery Restrictions Weight Bearing Restrictions: No       Mobility Bed Mobility Overal bed mobility: Needs Assistance Bed Mobility: Sit to Sidelying;Rolling Rolling: Supervision       Sit to sidelying: Min guard General bed mobility comments: light minA for LEs onto EOB; educated on use of log roll to decrease strain on abdominal region and for pain management  Transfers Overall transfer level: Needs assistance Equipment  used: Rolling walker (2 wheeled) Transfers: Sit to/from Stand Sit to Stand: Min guard         General transfer comment: multimodal cues for hand placement; assist to rise and steady at RW    Balance Overall balance assessment: Needs assistance Sitting-balance support: Feet supported       Standing balance support: Bilateral upper extremity supported Standing balance-Leahy Scale: Poor Standing balance comment: reliant on UE support                           ADL either performed or assessed with clinical judgement   ADL Overall ADL's : Needs assistance/impaired                                       General ADL Comments: pt mostly limited due to pain. pt denying need to perform grooming or toileting at this time.     Vision       Perception     Praxis      Cognition Arousal/Alertness: Awake/alert Behavior During Therapy: Flat affect;WFL for tasks assessed/performed Overall Cognitive Status: Within Functional Limits for tasks assessed                                          Exercises     Shoulder Instructions       General Comments      Pertinent Vitals/ Pain       Pain Assessment: Faces Faces Pain Scale: Hurts little more Pain  Location: abdomen Pain Descriptors / Indicators: Aching;Grimacing;Guarding  Home Living                                          Prior Functioning/Environment              Frequency  Min 2X/week        Progress Toward Goals  OT Goals(current goals can now be found in the care plan section)     Acute Rehab OT Goals Patient Stated Goal: less pain OT Goal Formulation: With patient Time For Goal Achievement: 05/12/18 Potential to Achieve Goals: Good  Plan      Co-evaluation                 AM-PAC OT "6 Clicks" Daily Activity     Outcome Measure   Help from another person eating meals?: None Help from another person taking care of personal  grooming?: A Little Help from another person toileting, which includes using toliet, bedpan, or urinal?: A Little Help from another person bathing (including washing, rinsing, drying)?: A Little Help from another person to put on and taking off regular upper body clothing?: None Help from another person to put on and taking off regular lower body clothing?: A Lot 6 Click Score: 19    End of Session Equipment Utilized During Treatment: Gait belt;Rolling walker  OT Visit Diagnosis: Other abnormalities of gait and mobility (R26.89);Muscle weakness (generalized) (M62.81);Pain Pain - part of body: (abdomen)   Activity Tolerance Patient tolerated treatment well;Patient limited by pain   Patient Left in bed;with call bell/phone within reach;with bed alarm set   Nurse Communication Mobility status        Time: 0354-6568 OT Time Calculation (min): 20 min  Charges: OT General Charges $OT Visit: 1 Visit OT Treatments $Therapeutic Activity: 8-22 mins  Darryl Nestle) [place]a Ar[place] OTR/L Acute Rehabilitation Services Pager: [phone] Office: [phone]   Fredda Hammed 04/29/2018, 4:04 PM

## 2018-04-30 ENCOUNTER — Inpatient Hospital Stay (HOSPITAL_COMMUNITY): Payer: No Typology Code available for payment source

## 2018-04-30 LAB — BASIC METABOLIC PANEL
Anion gap: 12 (ref 5–15)
BUN: 18 mg/dL (ref 8–23)
CO2: 27 mmol/L (ref 22–32)
Calcium: 8.1 mg/dL — ABNORMAL LOW (ref 8.9–10.3)
Chloride: 99 mmol/L (ref 98–111)
Creatinine, Ser: 0.84 mg/dL (ref 0.61–1.24)
GFR calc Af Amer: >60 mL/min (ref >60)
GFR calc non Af Amer: >60 mL/min (ref >60)
Glucose, Bld: 137 mg/dL — ABNORMAL HIGH (ref 70–99)
Potassium: 3.7 mmol/L (ref 3.5–5.1)
Sodium: 138 mmol/L (ref 135–145)

## 2018-04-30 LAB — CBC
HCT: 22.8 % — ABNORMAL LOW (ref 39.0–52.0)
HEMOGLOBIN: 7.7 g/dL — AB (ref 13.0–17.0)
MCH: 31 pg (ref 26.0–34.0)
MCHC: 33.8 g/dL (ref 30.0–36.0)
MCV: 91.9 fL (ref 80.0–100.0)
Platelets: 337 10*3/uL (ref 150–400)
RBC: 2.48 MIL/uL — ABNORMAL LOW (ref 4.22–5.81)
RDW: 14.4 % (ref 11.5–15.5)
WBC: 11.8 10*3/uL — ABNORMAL HIGH (ref 4.0–10.5)
nRBC: 0 % (ref 0.0–0.2)

## 2018-04-30 LAB — GLUCOSE, CAPILLARY
GLUCOSE-CAPILLARY: 182 mg/dL — AB (ref 70–99)
Glucose-Capillary: 163 mg/dL — ABNORMAL HIGH (ref 70–99)
Glucose-Capillary: 169 mg/dL — ABNORMAL HIGH (ref 70–99)
Glucose-Capillary: 237 mg/dL — ABNORMAL HIGH (ref 70–99)
Glucose-Capillary: 170 mg/dL — ABNORMAL HIGH (ref 70–99)

## 2018-04-30 MED ORDER — ENOXAPARIN SODIUM 40 MG/0.4ML ~~LOC~~ SOLN
40.0000 mg | SUBCUTANEOUS | Status: DC
  Administered 2018-04-30 – 2018-05-08 (×9): 40 mg via SUBCUTANEOUS
  Filled 2018-04-30 (×9): qty 0.4

## 2018-04-30 MED ORDER — PIPERACILLIN-TAZOBACTAM 3.375 G IVPB
3.3750 g | Freq: Three times a day (TID) | INTRAVENOUS | Status: AC
  Administered 2018-04-30 – 2018-05-04 (×14): 3.375 g via INTRAVENOUS
  Filled 2018-04-30 (×12): qty 50

## 2018-04-30 MED ORDER — VANCOMYCIN HCL IN DEXTROSE 1-5 GM/200ML-% IV SOLN
1000.0000 mg | INTRAVENOUS | Status: AC
  Administered 2018-04-30 – 2018-05-04 (×5): 1000 mg via INTRAVENOUS
  Filled 2018-04-30 (×5): qty 200

## 2018-04-30 MED ORDER — IOHEXOL 300 MG/ML  SOLN
150.0000 mL | Freq: Once | INTRAMUSCULAR | Status: AC | PRN
  Administered 2018-04-30: 75 mL via INTRAVENOUS

## 2018-04-30 MED ORDER — ZOLPIDEM TARTRATE 5 MG PO TABS
5.0000 mg | ORAL_TABLET | Freq: Every day | ORAL | Status: DC
  Administered 2018-05-02 – 2018-05-07 (×6): 5 mg via ORAL
  Filled 2018-04-30 (×7): qty 1

## 2018-04-30 NOTE — Progress Notes (Signed)
  Progress Note: General Surgery Service   Assessment/Plan: Active Problems:   S/P total gastrectomy and Roux-en-Y esophagojejunal anastomosis   Protein-calorie malnutrition, severe  s/p Procedure(s): LAPAROSCOPY DIAGNOSTIC ERAS PATHWAY TOTAL GASTRECTOMY DISTAL PANCREATECTOMY SPLENECTOMY INSERTION OF JEJUNOSTOMY TUBE 04/22/2018   CXr with pneumonia, u/a ok yesterday. Start antibiotics for pneumonia.   -oxy liquid and prn dilaudid -pt/OT eval rec intermittent supervision with [place]  Send drain for lipase.  Will not pull if elevated.    ABL anemia - restart lovenox Ambien at 10 pm.      LOS: 8 days  Chief Complaint/Subjective: Still having sleep issues despite pain meds and benadryl at 10 pm.  Also continues to retch.    Objective: Vital signs in last 24 hours: Temp:  [97.3 F (36.3 C)-99.9 F (37.7 C)] 98 F (36.7 C) (12/18 0429) Pulse Rate:  [81-92] 92 (12/18 0429) Resp:  [18] 18 (12/18 0429) BP: (102-128)/(66-82) 128/82 (12/18 0429) SpO2:  [95 %-96 %] 96 % (12/18 0429) Weight:  [48.6 kg] 48.6 kg (12/18 0419) Last BM Date: 04/29/18  Intake/Output from previous day: 12/17 0701 - 12/18 0700 In: 1985.1 [P.O.:180; I.V.:402.1; NG/GT:1353; IV Piggyback:50] Out: 1100 [Urine:500; Emesis/NG output:300; Drains:300] Intake/Output this shift: No intake/output data recorded.  General- alert and oriented.  Mild distress.    Lungs: nonlabored breathing  Cardiovascular: RRR  Abd: soft, incision c/d/i.  Drain serosang.  Leave for now as it is behind anastamosis as well as being near pancreatic stump.    Extremities: no edema  Neuro: AOx4  Lab Results: CBC  Recent Labs    04/29/18 0226 04/30/18 0204  WBC 17.1* 11.8*  HGB 7.9* 7.7*  HCT 23.8* 22.8*  PLT 332 337   BMET Recent Labs    04/29/18 0226 04/30/18 0204  NA 139 138  K 3.7 3.7  CL 101 99  CO2 26 27  GLUCOSE 162* 137*  BUN 18 18  CREATININE 0.72 0.84  CALCIUM 8.6* 8.1*   PT/INR No results for  input(s): LABPROT, INR in the last 72 hours. ABG No results for input(s): PHART, HCO3 in the last 72 hours.  Invalid input(s): PCO2, PO2  Studies/Results:  Anti-infectives: Anti-infectives (From admission, onward)   Start     Dose/Rate Route Frequency Ordered Stop   04/22/18 2200  ceFAZolin (ANCEF) IVPB 2g/100 mL premix     2 g 200 mL/hr over 30 Minutes Intravenous Every 8 hours 04/22/18 1935 04/22/18 2214   04/22/18 0600  ceFAZolin (ANCEF) IVPB 2g/100 mL premix     2 g 200 mL/hr over 30 Minutes Intravenous On call to O.R. 04/22/18 0544 04/22/18 1208      Medications: Scheduled Meds: . insulin aspart  0-9 Units Subcutaneous Q4H  . zolpidem  5 mg Oral QHS   Continuous Infusions: . dextrose 5 % and 0.45% NaCl 10 mL/hr at 04/29/18 2056  . feeding supplement (VITAL AF 1.2 CAL) 1,000 mL (04/29/18 2222)  . methocarbamol (ROBAXIN) IV 500 mg (04/29/18 2222)   PRN Meds:.acetaminophen, diphenhydrAMINE, hydrALAZINE, HYDROmorphone (DILAUDID) injection, methocarbamol (ROBAXIN) IV, ondansetron (ZOFRAN) IV, oxyCODONE, prochlorperazine **OR** prochlorperazine  Stark Klein, MD [facility]

## 2018-04-30 NOTE — Progress Notes (Signed)
Physical Therapy Treatment Patient Details Name: Nathan Massey MRN: [id number] DOB: 02-11-1951 Today's Date: 04/30/2018    History of Present Illness  Patient is a 67 year old male referred by Dr. Servando Snare for diagnosis of adenocarcinoma of the GE junction. 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), Gastric cancer     PT Comments    Continuing work on functional mobility and activity tolerance;  Able to increase amb distance; slow moving, but steady; cues for more upright posture; conitnue to recommend HHPT/OT follow up  Follow Up Recommendations  Home health PT;Supervision - Intermittent     Equipment Recommendations  Rolling walker with 5" wheels;3in1 (PT)    Recommendations for Other Services       Precautions / Restrictions Precautions Precautions: Fall Precaution Comments: Abdominal surgery, perc drain    Mobility  Bed Mobility Overal bed mobility: Needs Assistance Bed Mobility: Rolling;Sidelying to Sit;Sit to Sidelying Rolling: Supervision Sidelying to sit: Min assist     Sit to sidelying: Min guard General bed mobility comments: Min handheld assist to push upt from sidelying to sit; light minA for LEs onto EOB; educated on use of log roll to decrease strain on abdominal region and for pain management  Transfers Overall transfer level: Needs assistance Equipment used: Rolling walker (2 wheeled) Transfers: Sit to/from Stand Sit to Stand: Min guard         General transfer comment: multimodal cues for hand placement; assist to rise and steady at RW  Ambulation/Gait Ambulation/Gait assistance: Min guard Gait Distance (Feet): 150 Feet Assistive device: Rolling walker (2 wheeled) Gait Pattern/deviations:  Decreased step length - right;Decreased step length - left;Decreased stride length Gait velocity: slwoed   General Gait Details: Cues to self-monitor for activity tolerance   Stairs             Wheelchair Mobility    Modified Rankin (Stroke Patients Only)       Balance     Sitting balance-Leahy Scale: Good       Standing balance-Leahy Scale: Poor Standing balance comment: reliant on UE support                            Cognition Arousal/Alertness: Awake/alert Behavior During Therapy: Flat affect;WFL for tasks assessed/performed Overall Cognitive Status: Within Functional Limits for tasks assessed                                        Exercises      General Comments        Pertinent Vitals/Pain Pain Assessment: Faces Faces Pain Scale: Hurts little more Pain Location: abdomen Pain Descriptors / Indicators: Aching;Grimacing;Guarding Pain Intervention(s): Monitored during session    Home Living                      Prior Function            PT Goals (current goals can now be found in the care plan section) Acute Rehab PT Goals Patient Stated Goal: less pain PT Goal Formulation: With patient Time For Goal Achievement: 05/11/18 Potential to Achieve Goals: Good Progress towards PT goals: Progressing toward goals    Frequency    Min  3X/week      PT Plan Current plan remains appropriate    Co-evaluation              AM-PAC PT "6 Clicks" Mobility   Outcome Measure  Help needed turning from your back to your side while in a flat bed without using bedrails?: A Little Help needed moving from lying on your back to sitting on the side of a flat bed without using bedrails?: A Lot Help needed moving to and from a bed to a chair (including a wheelchair)?: A Little Help needed standing up from a chair using your arms (e.g., wheelchair or bedside chair)?: A Little Help needed to walk in hospital room?: A  Little Help needed climbing 3-5 steps with a railing? : A Lot 6 Click Score: 16    End of Session Equipment Utilized During Treatment: Gait belt Activity Tolerance: Patient tolerated treatment well Patient left: in bed;with call bell/phone within reach Nurse Communication: Mobility status PT Visit Diagnosis: Unsteadiness on feet (R26.81);Other abnormalities of gait and mobility (R26.89);Pain Pain - part of body: (abdominal)     Time: 1660-6301 PT Time Calculation (min) (ACUTE ONLY): 23 min  Charges:  $Gait Training: 8-22 mins $Therapeutic Activity: 8-22 mins                     Roney Marion, PT  Acute Rehabilitation Services Pager [phone] Office [place] 04/30/2018, 5:03 PM

## 2018-04-30 NOTE — Plan of Care (Signed)
  Problem: Coping: Goal: Level of anxiety will decrease Outcome: Progressing   Problem: Elimination: Goal: Will not experience complications related to urinary retention Outcome: Progressing   Problem: Skin Integrity: Goal: Risk for impaired skin integrity will decrease Outcome: Progressing

## 2018-04-30 NOTE — Care Management (Signed)
Faxed update to [name] RN at  [facility] clinic [phone].  [name] RN BSN [phone]

## 2018-04-30 NOTE — Progress Notes (Addendum)
Nutrition Follow-up  DOCUMENTATION CODES:   Severe malnutrition in context of chronic illness  INTERVENTION:   -Continue Vital AF 1.2 @ 55 ml/hr via j-tube  Tube feeding regimen provides 1584 kcal (100% of needs), 99 grams of protein, and 1070 ml of H2O.   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  The patient presented to the [place] [facility] in [place] in February 2019 with dysphasia.  Work-up showed a 9 cm mass at the GE junction.  12/10- s/p TOTAL GASTRECTOMY ; DISTAL PANCREATECTOMY; SPLENECTOMY; INSERTION OF JEJUNOSTOMY TUBE 12/11- trickle feeds initiated at 10 ml/hr 12/12- NGT d/c 12/13- transferred to surgical floor 12/16- TF advanced to goal rate of 55 ml/hr, advanced to full liquid diet  Reviewed I/O's: +885 ml x 24 hours and +5.8 L since admission  Drains: 300 ml output x 24 hours  Emesis: 300 ml x 24 hours  Pt resting quietly at time of visit. RD did not disturb.   Pt remains on full liquids, but still not taking any PO's.   Vital AF 1.2 infusing via j-tube @ 55 ml/hr. Regimen providing 1584 kcal, 99 grams of protein, and 1070 ml of H2O, which meets 100% of estimated kcals and protein needs.   Per RNCM notes, arrangements for home health and TF have been arranged for potential discharge home later this week.   Labs reviewed: CBGS: [phone] (inpatient orders for glycemic control are 0-9 units insulin aspart every 4 hours).   Diet Order:   Diet Order             Diet full liquid Room service appropriate? Yes; Fluid consistency: Thin  Diet effective now              EDUCATION NEEDS:   No education needs have been identified at this time  Skin:  Skin Assessment: Skin Integrity Issues: Skin Integrity Issues:: Incisions Incisions: surgical; abdominal  Last BM:  04/29/18  Height:   Ht Readings from Last 1 Encounters:  04/25/18 '5\' 6"'  (1.676 m)    Weight:   Wt Readings from Last 1 Encounters:  04/30/18 48.6 kg    Ideal Body Weight:  64.5 kg  BMI:  Body mass index is 17.29 kg/m.  Estimated Nutritional Needs:   Kcal:  1600-1800  Protein:  90-105 gm  Fluid:  1.6-1.8 L    Nathan Massey A. Jimmye Norman, RD, LDN, CDE Pager: [phone] After hours Pager: [phone]

## 2018-04-30 NOTE — Progress Notes (Signed)
Pharmacy Antibiotic Note  Nathan Massey is a 67 y.o. male admitted on 04/22/2018 for surgery for gastric cancer.  Now with RUL PNA on CXR and Pharmacy has been consulted for vancomycin and Zosyn dosing.  SCr 0.84 stable, CrCL 58 ml/min, afebrile, WBC down to 11.8.   Plan: Vanc 1gm IV Q24H for AUC 545 using SCr 0.84 Zosyn EID 3.375gm IV Q8H Monitor renal fxn, clinical progress, vanc levels as indicated   Height: 5\' 6"  (167.6 cm) Weight: 107 lb 2.3 oz (48.6 kg) IBW/kg (Calculated) : 63.8  Temp (24hrs), Avg:98.4 F (36.9 C), Min:97.3 F (36.3 C), Max:99.9 F (37.7 C)  Recent Labs  Lab 04/26/18 0459 04/27/18 0237 04/28/18 0606 04/29/18 0226 04/30/18 0204  WBC 4.0 7.4 7.9 17.1* 11.8*  CREATININE 0.85 0.76 0.77 0.72 0.84    Estimated Creatinine Clearance: 57.9 mL/min (by C-G formula based on SCr of 0.84 mg/dL).    No Known Allergies   Vanc 12/18 >> Zosyn 12/18 >>  Serrita Lueth D. Mina Marble, PharmD, BCPS, [place] 04/30/2018, 10:01 AM

## 2018-04-30 NOTE — Care Management (Signed)
Lennette Bihari RN from [facility] [phone] called. He received orders for home health, he will arrange home health, [name] at [facility] will arrange Tube feeds, tube feed pump, walker and 3 in 1. Lennette Bihari wanted to know if patient has a preference in home health agency. Went to patient's room explained same patient states he has no preference. Lennette Bihari will call patient and NCM with name of home health agency.   [name] RN BSN [phone]

## 2018-05-01 ENCOUNTER — Inpatient Hospital Stay (HOSPITAL_COMMUNITY): Payer: No Typology Code available for payment source

## 2018-05-01 ENCOUNTER — Ambulatory Visit: Payer: Non-veteran care | Admitting: Cardiothoracic Surgery

## 2018-05-01 LAB — GLUCOSE, CAPILLARY
GLUCOSE-CAPILLARY: 154 mg/dL — AB (ref 70–99)
GLUCOSE-CAPILLARY: 214 mg/dL — AB (ref 70–99)
Glucose-Capillary: 193 mg/dL — ABNORMAL HIGH (ref 70–99)
Glucose-Capillary: 209 mg/dL — ABNORMAL HIGH (ref 70–99)
Glucose-Capillary: 225 mg/dL — ABNORMAL HIGH (ref 70–99)
Glucose-Capillary: 150 mg/dL — ABNORMAL HIGH (ref 70–99)
Glucose-Capillary: 156 mg/dL — ABNORMAL HIGH (ref 70–99)

## 2018-05-01 LAB — CBC
HCT: 24.9 % — ABNORMAL LOW (ref 39.0–52.0)
HEMOGLOBIN: 8.4 g/dL — AB (ref 13.0–17.0)
MCH: 31.1 pg (ref 26.0–34.0)
MCHC: 33.7 g/dL (ref 30.0–36.0)
MCV: 92.2 fL (ref 80.0–100.0)
Platelets: 423 10*3/uL — ABNORMAL HIGH (ref 150–400)
RBC: 2.7 MIL/uL — AB (ref 4.22–5.81)
RDW: 14.6 % (ref 11.5–15.5)
WBC: 6.4 10*3/uL (ref 4.0–10.5)
nRBC: 0.3 % — ABNORMAL HIGH (ref 0.0–0.2)

## 2018-05-01 LAB — BASIC METABOLIC PANEL
Anion gap: 11 (ref 5–15)
BUN: 18 mg/dL (ref 8–23)
CO2: 31 mmol/L (ref 22–32)
Calcium: 8.1 mg/dL — ABNORMAL LOW (ref 8.9–10.3)
Chloride: 97 mmol/L — ABNORMAL LOW (ref 98–111)
Creatinine, Ser: 0.77 mg/dL (ref 0.61–1.24)
GFR calc Af Amer: >60 mL/min (ref >60)
GFR calc non Af Amer: >60 mL/min (ref >60)
Glucose, Bld: 189 mg/dL — ABNORMAL HIGH (ref 70–99)
POTASSIUM: 4 mmol/L (ref 3.5–5.1)
Sodium: 139 mmol/L (ref 135–145)

## 2018-05-01 NOTE — Progress Notes (Signed)
Progress Note: General Surgery Service   Assessment/Plan: Active Problems:   S/P total gastrectomy and Roux-en-Y esophagojejunal anastomosis   Protein-calorie malnutrition, severe  s/p Procedure(s): LAPAROSCOPY DIAGNOSTIC ERAS PATHWAY TOTAL GASTRECTOMY DISTAL PANCREATECTOMY SPLENECTOMY INSERTION OF JEJUNOSTOMY TUBE 04/22/2018   CXr with pneumonia, u/a ok yesterday. Antibiotics for pneumonia.   -oxy liquid and prn dilaudid -pt/OT eval rec intermittent supervision with [place]  Send drain for lipase.  Will not pull if elevated.     ABL anemia -  lovenox Ambien at 10 pm.  Didn't help last night.    CT today without contrast.  Then place NGT to see if decompression of proximal limb will help retching.   Continue tube feeds at goal.     LOS: 9 days  Chief Complaint/Subjective: UGI with SBFT did not show leak or specific transition point, but did show no passage of contrast into distal small bowel.    Objective: Vital signs in last 24 hours: Temp:  [97.3 F (36.3 C)-99.7 F (37.6 C)] 97.3 F (36.3 C) (12/19 0442) Pulse Rate:  [84-96] 91 (12/19 0442) Resp:  [17-18] 17 (12/19 0442) BP: (127-129)/(61-74) 127/74 (12/19 0442) SpO2:  [95 %-100 %] 100 % (12/19 0442) Last BM Date: 04/29/18  Intake/Output from previous day: 12/18 0701 - 12/19 0700 In: 1335.4 [P.O.:110; I.V.:59; NG/GT:510.6; IV Piggyback:655.8] Out: 1250 [Urine:825; Emesis/NG output:100; Drains:325] Intake/Output this shift: No intake/output data recorded.  General- alert and oriented.  Looks definitely uncomfortable.    Lungs: nonlabored breathing  Cardiovascular: RRR  Abd: soft, incision c/d/i.  Drain serosang.  Leave for now as it is behind anastamosis as well as being near pancreatic stump.    Extremities: no edema  Neuro: AOx4  Lab Results: CBC  Recent Labs    04/30/18 0204 05/01/18 0302  WBC 11.8* 6.4  HGB 7.7* 8.4*  HCT 22.8* 24.9*  PLT 337 423*   BMET Recent Labs    04/30/18 0204  05/01/18 0302  NA 138 139  K 3.7 4.0  CL 99 97*  CO2 27 31  GLUCOSE 137* 189*  BUN 18 18  CREATININE 0.84 0.77  CALCIUM 8.1* 8.1*   PT/INR No results for input(s): LABPROT, INR in the last 72 hours. ABG No results for input(s): PHART, HCO3 in the last 72 hours.  Invalid input(s): PCO2, PO2  Studies/Results:  Anti-infectives: Anti-infectives (From admission, onward)   Start     Dose/Rate Route Frequency Ordered Stop   04/30/18 1100  vancomycin (VANCOCIN) IVPB 1000 mg/200 mL premix     1,000 mg 200 mL/hr over 60 Minutes Intravenous Every 24 hours 04/30/18 1002     04/30/18 1100  piperacillin-tazobactam (ZOSYN) IVPB 3.375 g     3.375 g 12.5 mL/hr over 240 Minutes Intravenous Every 8 hours 04/30/18 1002     04/22/18 2200  ceFAZolin (ANCEF) IVPB 2g/100 mL premix     2 g 200 mL/hr over 30 Minutes Intravenous Every 8 hours 04/22/18 1935 04/22/18 2214   04/22/18 0600  ceFAZolin (ANCEF) IVPB 2g/100 mL premix     2 g 200 mL/hr over 30 Minutes Intravenous On call to O.R. 04/22/18 0544 04/22/18 1208      Medications: Scheduled Meds: . enoxaparin (LOVENOX) injection  40 mg Subcutaneous Q24H  . insulin aspart  0-9 Units Subcutaneous Q4H  . zolpidem  5 mg Oral QHS   Continuous Infusions: . dextrose 5 % and 0.45% NaCl Stopped (04/30/18 1154)  . feeding supplement (VITAL AF 1.2 CAL) 1,000 mL (04/30/18 1850)  .  methocarbamol (ROBAXIN) IV 500 mg (04/30/18 2331)  . piperacillin-tazobactam (ZOSYN)  IV 3.375 g (05/01/18 0242)  . vancomycin 200 mL/hr at 04/30/18 1157   PRN Meds:.acetaminophen, diphenhydrAMINE, hydrALAZINE, HYDROmorphone (DILAUDID) injection, methocarbamol (ROBAXIN) IV, ondansetron (ZOFRAN) IV, oxyCODONE, prochlorperazine **OR** prochlorperazine  Stark Klein, MD [facility]

## 2018-05-01 NOTE — Care Management Note (Addendum)
Case Management Note  Patient Details  Name: Nathan Massey MRN: [id number] Date of Birth: 1950-12-07  Subjective/Objective:                    Action/Plan:  Received a call from [facility] at [facility].  [facility]ab[facility] Harries will be providing home health services. Provided Sharyn Lull with update regarding CT scan and gastric tube placement today.    Also received a call from Maxwell Marion a dietitian at [facility] [address]. Also,  provided Ms Mel Almond with update.    Once CT scan results known Sharyn Lull at [place] , [facility] , and [facility] would like update.    Called Maxwell Marion ( Dietitian at [facility]) phone [phone] ext [phone] and [name] RN at [facility] phone [phone] fax [phone], left voicemails for both, faxed today's progress note to Ms [facility].   Verita Lamb at [facility] fax [phone], Sharyn Lull unavailable , spoke to her co worker Maudie Mercury and provided update. Expected Discharge Date:                  Expected Discharge Plan:  [place]  In-House Referral:     Discharge planning Services  CM Consult  Post Acute Care Choice:  Home Health, Durable Medical Equipment Choice offered to:  Patient  DME Arranged:  3-N-1, Walker rolling, Tube feeding pump, Tube feeding DME Agency:     HH Arranged:  RN, PT, OT, Nurse's Aide [place] Agency:  Other - See comment  Status of Service:  In process, will continue to follow  If discussed at Long Length of Stay Meetings, dates discussed:    Additional Comments:  Marilu Favre, RN 05/01/2018, 10:36 AM

## 2018-05-01 NOTE — Progress Notes (Signed)
Inpatient Diabetes Program Recommendations  AACE/ADA: New Consensus Statement on Inpatient Glycemic Control (2015)  Target Ranges:  Prepandial:   less than 140 mg/dL      Peak postprandial:   less than 180 mg/dL (1-2 hours)      Critically ill patients:  140 - 180 mg/dL   Lab Results  Component Value Date   GLUCAP 209 (H) 05/01/2018   HGBA1C 6.5 (H) 04/16/2018    Review of Glycemic Control Results for BRODY, BONNEAU (MRN [id number]) as of 05/01/2018 09:59  Ref. Range 05/01/2018 00:23 05/01/2018 04:39 05/01/2018 07:49  Glucose-Capillary Latest Ref Range: 70 - 99 mg/dL 214 (H) 193 (H) 209 (H)   Diabetes history: Type 2 dM Outpatient Diabetes medications: Metformin 500 mg with supper Current orders for Inpatient glycemic control: Novolog 0-9 units Q4H  Inpatient Diabetes Program Recommendations:    Blood glucose trending up, most likely in the setting of tube feeds. Consider adding Levemir 6 units QD.   Thanks, Bronson Curb, MSN, RNC-OB Diabetes Coordinator [phone] (8a-5p)

## 2018-05-02 LAB — GLUCOSE, CAPILLARY
GLUCOSE-CAPILLARY: 173 mg/dL — AB (ref 70–99)
GLUCOSE-CAPILLARY: 208 mg/dL — AB (ref 70–99)
Glucose-Capillary: 125 mg/dL — ABNORMAL HIGH (ref 70–99)
Glucose-Capillary: 165 mg/dL — ABNORMAL HIGH (ref 70–99)
Glucose-Capillary: 169 mg/dL — ABNORMAL HIGH (ref 70–99)
Glucose-Capillary: 193 mg/dL — ABNORMAL HIGH (ref 70–99)

## 2018-05-02 LAB — CBC
HEMATOCRIT: 23.8 % — AB (ref 39.0–52.0)
HEMOGLOBIN: 8 g/dL — AB (ref 13.0–17.0)
MCH: 31 pg (ref 26.0–34.0)
MCHC: 33.6 g/dL (ref 30.0–36.0)
MCV: 92.2 fL (ref 80.0–100.0)
Platelets: 504 10*3/uL — ABNORMAL HIGH (ref 150–400)
RBC: 2.58 MIL/uL — AB (ref 4.22–5.81)
RDW: 14.5 % (ref 11.5–15.5)
WBC: 6.6 10*3/uL (ref 4.0–10.5)
nRBC: 0.3 % — ABNORMAL HIGH (ref 0.0–0.2)

## 2018-05-02 LAB — BASIC METABOLIC PANEL
Anion gap: 11 (ref 5–15)
BUN: 18 mg/dL (ref 8–23)
CO2: 30 mmol/L (ref 22–32)
Calcium: 8.2 mg/dL — ABNORMAL LOW (ref 8.9–10.3)
Chloride: 98 mmol/L (ref 98–111)
Creatinine, Ser: 0.79 mg/dL (ref 0.61–1.24)
GFR calc Af Amer: >60 mL/min (ref >60)
GFR calc non Af Amer: >60 mL/min (ref >60)
Glucose, Bld: 171 mg/dL — ABNORMAL HIGH (ref 70–99)
POTASSIUM: 3.9 mmol/L (ref 3.5–5.1)
Sodium: 139 mmol/L (ref 135–145)

## 2018-05-02 NOTE — Progress Notes (Signed)
Progress Note: General Surgery Service   Assessment/Plan: Active Problems:   S/P total gastrectomy and Roux-en-Y esophagojejunal anastomosis   Protein-calorie malnutrition, severe  s/p Procedure(s): LAPAROSCOPY DIAGNOSTIC ERAS PATHWAY TOTAL GASTRECTOMY DISTAL PANCREATECTOMY SPLENECTOMY INSERTION OF JEJUNOSTOMY TUBE 04/22/2018   CXr with pneumonia, antibiotics for pneumonia.   -oxy liquid and prn dilaudid -PT/OT eval rec intermittent supervision with [place]  Send drain for lipase.  Will not pull if elevated.     ABL anemia -  lovenox NGT to stay in for proximal small bowel.     Continue tube feeds at goal.     LOS: 10 days  Chief Complaint/Subjective: Pt with significantly less retching given NGT placement yesterday.    Objective: Vital signs in last 24 hours: Temp:  [97.6 F (36.4 C)-98.4 F (36.9 C)] 97.8 F (36.6 C) (12/20 0426) Pulse Rate:  [79-95] 80 (12/20 0426) Resp:  [16-17] 16 (12/20 0426) BP: (121-134)/(58-85) 121/58 (12/20 0426) SpO2:  [97 %-99 %] 97 % (12/20 0426) Last BM Date: 04/29/18  Intake/Output from previous day: 12/19 0701 - 12/20 0700 In: 784.3 [I.V.:110; NG/GT:624.3; IV Piggyback:50] Out: 1325 [Urine:475; Emesis/NG output:600; Drains:250] Intake/Output this shift: No intake/output data recorded.  General- alert and oriented.  Looks definitely uncomfortable.    Lungs: nonlabored breathing  Cardiovascular: RRR  Abd: soft, incision c/d/i.  Drain serosang.  Leave for now as it is behind anastamosis as well as being near pancreatic stump.    Extremities: no edema  Neuro: AOx4  Lab Results: CBC  Recent Labs    05/01/18 0302 05/02/18 0223  WBC 6.4 6.6  HGB 8.4* 8.0*  HCT 24.9* 23.8*  PLT 423* 504*   BMET Recent Labs    05/01/18 0302 05/02/18 0223  NA 139 139  K 4.0 3.9  CL 97* 98  CO2 31 30  GLUCOSE 189* 171*  BUN 18 18  CREATININE 0.77 0.79  CALCIUM 8.1* 8.2*   PT/INR No results for input(s): LABPROT, INR in the last  72 hours. ABG No results for input(s): PHART, HCO3 in the last 72 hours.  Invalid input(s): PCO2, PO2  Studies/Results:  Anti-infectives: Anti-infectives (From admission, onward)   Start     Dose/Rate Route Frequency Ordered Stop   04/30/18 1100  vancomycin (VANCOCIN) IVPB 1000 mg/200 mL premix     1,000 mg 200 mL/hr over 60 Minutes Intravenous Every 24 hours 04/30/18 1002     04/30/18 1100  piperacillin-tazobactam (ZOSYN) IVPB 3.375 g     3.375 g 12.5 mL/hr over 240 Minutes Intravenous Every 8 hours 04/30/18 1002     04/22/18 2200  ceFAZolin (ANCEF) IVPB 2g/100 mL premix     2 g 200 mL/hr over 30 Minutes Intravenous Every 8 hours 04/22/18 1935 04/22/18 2214   04/22/18 0600  ceFAZolin (ANCEF) IVPB 2g/100 mL premix     2 g 200 mL/hr over 30 Minutes Intravenous On call to O.R. 04/22/18 0544 04/22/18 1208      Medications: Scheduled Meds: . enoxaparin (LOVENOX) injection  40 mg Subcutaneous Q24H  . insulin aspart  0-9 Units Subcutaneous Q4H  . zolpidem  5 mg Oral QHS   Continuous Infusions: . dextrose 5 % and 0.45% NaCl 10 mL/hr at 05/01/18 1232  . feeding supplement (VITAL AF 1.2 CAL) 1,000 mL (05/02/18 1011)  . methocarbamol (ROBAXIN) IV 500 mg (05/01/18 2147)  . piperacillin-tazobactam (ZOSYN)  IV 3.375 g (05/02/18 1008)  . vancomycin 1,000 mg (05/02/18 1009)   PRN Meds:.acetaminophen, diphenhydrAMINE, hydrALAZINE, HYDROmorphone (DILAUDID) injection, methocarbamol (  ROBAXIN) IV, ondansetron (ZOFRAN) IV, oxyCODONE, prochlorperazine **OR** prochlorperazine  Stark Klein, MD [facility]

## 2018-05-02 NOTE — Progress Notes (Signed)
Physical Therapy Treatment Patient Details Name: Nathan Massey MRN: [id number] DOB: February 10, 1951 Today's Date: 05/02/2018    History of Present Illness  Patient is a 67 year old male referred by Dr. Servando Snare for diagnosis of adenocarcinoma of the GE junction. 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  He remains appropriate to return home with support from his family.  Plan next session for stair training.      Follow Up Recommendations  Home health PT;Supervision - Intermittent     Equipment Recommendations  Rolling walker with 5" wheels;3in1 (PT)    Recommendations for Other Services       Precautions / Restrictions Precautions Precautions: Fall Precaution Comments: Abdominal surgery, perc drain, NG tube Restrictions Weight Bearing Restrictions: No    Mobility  Bed Mobility Overal bed mobility: Needs Assistance Bed Mobility: Rolling;Sidelying to Sit;Sit to Sidelying Rolling: Supervision Sidelying to sit: Supervision     Sit to sidelying: Supervision General bed mobility comments: Cues for sequencing and rolling to achieve sitting edge of bed.  PTA managing lines and leads.  Pt returned to bed unassisted.    Transfers Overall transfer level: Needs assistance Equipment used: Rolling walker (2 wheeled) Transfers: Sit to/from Stand Sit to Stand: Min guard         General transfer comment: Cues for sequencing to push from seated surface.  Pt required cues to reach back but kept hands on RW.    Ambulation/Gait Ambulation/Gait assistance: Min guard Gait Distance (Feet): 150 Feet Assistive device: Rolling walker (2  wheeled) Gait Pattern/deviations: Decreased step length - right;Decreased step length - left;Decreased stride length     General Gait Details: Cues for upper trunk control and pacing.  Pt flexed over RW and mildly unsteady.     Stairs             Wheelchair Mobility    Modified Rankin (Stroke Patients Only)       Balance Overall balance assessment: Needs assistance   Sitting balance-Leahy Scale: Good       Standing balance-Leahy Scale: Poor                              Cognition Arousal/Alertness: Awake/alert Behavior During Therapy: Flat affect;WFL for tasks assessed/performed Overall Cognitive Status: Within Functional Limits for tasks assessed                                        Exercises      General Comments        Pertinent Vitals/Pain Pain Assessment: Faces Faces Pain Scale: Hurts little more Pain Location: abdomen Pain Descriptors / Indicators: Grimacing;Guarding Pain Intervention(s): Monitored during session;Repositioned    Home Living                      Prior Function            PT Goals (current goals can now be found in the care plan section) Acute Rehab PT Goals Patient Stated Goal: less pain Potential to Achieve Goals: Good Progress towards PT goals: Progressing  toward goals    Frequency           PT Plan Current plan remains appropriate    Co-evaluation              AM-PAC PT "6 Clicks" Mobility   Outcome Measure  Help needed turning from your back to your side while in a flat bed without using bedrails?: A Little Help needed moving from lying on your back to sitting on the side of a flat bed without using bedrails?: A Little Help needed moving to and from a bed to a chair (including a wheelchair)?: A Little Help needed standing up from a chair using your arms (e.g., wheelchair or bedside chair)?: A Little Help needed to walk in hospital room?: A Little Help needed  climbing 3-5 steps with a railing? : A Lot 6 Click Score: 17    End of Session Equipment Utilized During Treatment: Gait belt Activity Tolerance: Patient tolerated treatment well Patient left: in bed;with call bell/phone within reach Nurse Communication: Mobility status PT Visit Diagnosis: Unsteadiness on feet (R26.81);Other abnormalities of gait and mobility (R26.89);Pain Pain - part of body: (abdominal)     Time: 1248-1300 PT Time Calculation (min) (ACUTE ONLY): 12 min  Charges:  $Gait Training: 8-22 mins                     Governor Rooks, PTA Acute Rehabilitation Services Pager [phone] Office [phone]     Sibbie Flammia Eli Hose 05/02/2018, 1:58 PM

## 2018-05-02 NOTE — Evaluation (Signed)
Occupational Therapy Evaluation Patient Details Name: Nathan Massey MRN: [id number] DOB: 17-Jun-1950 Today's Date: 05/02/2018    History of Present Illness  Patient is a 67 year old male referred by Dr. Servando Snare for diagnosis of adenocarcinoma of the GE junction. 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 Mobility tech about to assist with closing off NG tube at this time. Pt sitting EOB x3 mins  With fair sitting balance and and declining need to for additional ADLs in sitting and additional mobility at this time. Pt returned to supine and sidelying with MinA. Pt continues to be limited by pain, nausea and vomitting. Pt min A overall with ADLs and mobility. Pt would greatly benefit from continued OT skilled services in [facility] setting.      Follow Up Recommendations  Home health OT;Supervision - Intermittent    Equipment Recommendations  3 in 1 bedside commode    Recommendations for Other Services       Precautions / Restrictions Precautions Precautions: Fall Precaution Comments: Abdominal surgery Restrictions Weight Bearing Restrictions: No      Mobility Bed Mobility Overal bed mobility: Needs Assistance Bed Mobility: Sit to Sidelying;Rolling Rolling: Supervision Sidelying to sit: Supervision     Sit to sidelying: Min assist General bed mobility comments: light minA for LEs onto EOB; educated on use of log roll to decrease strain on abdominal region and for pain management  Transfers Overall transfer level: Needs assistance Equipment used: Rolling walker (2 wheeled) Transfers: Sit  to/from Stand Sit to Stand: Min guard         General transfer comment: Cues for sequencing to push from seated surface.  Pt required cues to reach back but kept hands on RW.      Balance Overall balance assessment: Needs assistance Sitting-balance support: Feet supported Sitting balance-Leahy Scale: Good       Standing balance-Leahy Scale: Poor                             ADL either performed or assessed with clinical judgement   ADL Overall ADL's : Needs assistance/impaired                                     Functional mobility during ADLs: Minimal assistance;Rolling walker General ADL Comments: pt mostly limited due to pain     Vision         Perception     Praxis      Pertinent Vitals/Pain Pain Assessment: Faces Faces Pain Scale: Hurts little more Pain Location: abdomen Pain Descriptors / Indicators: Aching;Grimacing;Guarding Pain Intervention(s): Monitored during session;Repositioned     Hand Dominance     Extremity/Trunk Assessment             Communication     Cognition Arousal/Alertness: Awake/alert Behavior During Therapy: Flat affect;WFL for tasks assessed/performed Overall Cognitive Status: Within Functional Limits for tasks assessed  General Comments       Exercises     Shoulder Instructions      Home Living                                          Prior Functioning/Environment                   OT Problem List:        OT Treatment/Interventions:      OT Goals(Current goals can be found in the care plan section) Acute Rehab OT Goals Patient Stated Goal: less pain OT Goal Formulation: With patient Time For Goal Achievement: 05/12/18 Potential to Achieve Goals: Good ADL Goals Pt Will Perform Grooming: with modified independence;standing Pt Will Perform Lower Body Bathing: with modified independence;sit to/from stand;with  adaptive equipment Pt Will Perform Upper Body Dressing: with modified independence;sitting Pt Will Perform Lower Body Dressing: with modified independence;sit to/from stand Pt Will Transfer to Toilet: with modified independence;ambulating;bedside commode Pt Will Perform Toileting - Clothing Manipulation and hygiene: with modified independence;sit to/from stand  OT Frequency: Min 2X/week   Barriers to D/C:            Co-evaluation              AM-PAC OT "6 Clicks" Daily Activity     Outcome Measure Help from another person eating meals?: None Help from another person taking care of personal grooming?: A Little Help from another person toileting, which includes using toliet, bedpan, or urinal?: A Little Help from another person bathing (including washing, rinsing, drying)?: A Little Help from another person to put on and taking off regular upper body clothing?: None Help from another person to put on and taking off regular lower body clothing?: A Lot 6 Click Score: 19   End of Session Nurse Communication: Mobility status  Activity Tolerance: Patient tolerated treatment well;Patient limited by pain Patient left: in bed;with call bell/phone within reach;with bed alarm set  OT Visit Diagnosis: Other abnormalities of gait and mobility (R26.89);Muscle weakness (generalized) (M62.81);Pain Pain - part of body: (abdomen)                Time: 1152-1209 OT Time Calculation (min): 17 min Charges:  OT General Charges $OT Visit: 1 Visit OT Treatments $Therapeutic Activity: 8-22 mins  Darryl Nestle) Marsa Aris OTR/L Acute Rehabilitation Services Pager: [phone] Office: [phone]  Fredda Hammed 05/02/2018, 3:04 PM

## 2018-05-02 NOTE — Care Management (Signed)
Provided update to [place] at  [facility], fax fax [phone].  [facility]abas Harries will be providing home health services.         Provided update to  [name] RN at [facility] phone [phone] fax [phone].   [name] RN BSN [phone]

## 2018-05-02 NOTE — Progress Notes (Addendum)
Nutrition Follow-up  DOCUMENTATION CODES:   Severe malnutrition in context of chronic illness  INTERVENTION:   -Continue Vital AF 1.2@ 27m/hr via j-tube  Tube feeding regimen provides1584kcal (100% of needs),99grams of protein, and 10743mof H2O.   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  [facility] with [facility].  Work-up showed a 9 cm mass at the GE junction.  12/10- s/pTOTAL GASTRECTOMY;DISTAL PANCREATECTOMY;SPLENECTOMY; INSERTION OF JEJUNOSTOMY TUBE 12/11- trickle feeds initiated at 10 ml/hr 12/12- NGT d/c 12/13- transferred to surgical floor 12/16- TF advanced to goal rate of 55 ml/hr, advanced to full liquid diet 12/19- NPO, s/p CT with contract, NGT to decompression due to vomiting  Reviewed I/O's: -541 ml x 24 hours and +5.3 L since admission  Drains: 250 ml output x 24 hours  NGT output: 600 ml x 24 hours  Pt lying in bed at time of visit. He reports "I really am feeling better today". He reports he is continuing to tolerate TF well.   Vital AF 1.2 infusing via j-tube @ 55 ml/hr. Regimen providing 1584kcal,99grams of protein, and 107017mf H2O, which meets 100% of estimated kcals and protein needs.   Labs reviewed: CBGS: [id number] orders for glycemic control are 0-9 units insulin aspart every 4 hours).   Diet Order:   Diet Order            Diet NPO time specified  Diet effective now              EDUCATION NEEDS:   No education  needs have been identified at this time  Skin:  Skin Assessment: Skin Integrity Issues: Skin Integrity Issues:: Incisions Incisions: surgical; abdominal  Last BM:  05/01/18  Height:   Ht Readings from Last 1 Encounters:  04/25/18 '5\' 6"'  (1.676 m)    Weight:   Wt Readings from Last 1 Encounters:  04/30/18 48.6 kg    Ideal Body Weight:  64.5 kg  BMI:  Body mass index is 17.29 kg/m.  Estimated Nutritional Needs:   Kcal:  1600-1800  Protein:  90-105 gm  Fluid:  1.6-1.8 L    Idamay Hosein A. WilJimmye NormanD, LDN, CDE Pager: [phone] hours Pager: [phone]

## 2018-05-03 LAB — GLUCOSE, CAPILLARY
GLUCOSE-CAPILLARY: 126 mg/dL — AB (ref 70–99)
Glucose-Capillary: 122 mg/dL — ABNORMAL HIGH (ref 70–99)
Glucose-Capillary: 125 mg/dL — ABNORMAL HIGH (ref 70–99)
Glucose-Capillary: 137 mg/dL — ABNORMAL HIGH (ref 70–99)
Glucose-Capillary: 177 mg/dL — ABNORMAL HIGH (ref 70–99)
Glucose-Capillary: 183 mg/dL — ABNORMAL HIGH (ref 70–99)

## 2018-05-03 NOTE — Progress Notes (Addendum)
2040 patient has broken drainage tubing down at attachment of drainage bag. Small amount leaked out. Clamped tubing and taped, paged Dr Redmond Pulling. OR called for provider to follow up, took message, will have to call back when finished in [place].   [phone] re-paged provider.  Dr Redmond Pulling returned call; will have IR  replace tomorrow.  Nurse will pass to day shift.

## 2018-05-03 NOTE — Plan of Care (Signed)
  Problem: Education: Goal: Knowledge of General Education information will improve Description Including pain rating scale, medication(s)/side effects and non-pharmacologic comfort measures Outcome: Progressing   Problem: Health Behavior/Discharge Planning: Goal: Ability to manage health-related needs will improve Outcome: Progressing   Problem: Clinical Measurements: Goal: Ability to maintain clinical measurements within normal limits will improve Outcome: Progressing Goal: Will remain free from infection Outcome: Progressing   Problem: Activity: Goal: Risk for activity intolerance will decrease Outcome: Progressing   Problem: Nutrition: Goal: Adequate nutrition will be maintained Outcome: Progressing   Problem: Coping: Goal: Level of anxiety will decrease Outcome: Progressing   Problem: Elimination: Goal: Will not experience complications related to bowel motility Outcome: Progressing Goal: Will not experience complications related to urinary retention Outcome: Progressing   Problem: Pain Managment: Goal: General experience of comfort will improve Outcome: Progressing   Problem: Skin Integrity: Goal: Risk for impaired skin integrity will decrease Outcome: Progressing

## 2018-05-03 NOTE — Progress Notes (Signed)
11 Days Post-Op   Subjective/Chief Complaint: No c/o other than some soreness In right abd Reports flatus and BMs Just getting up on edge of bed   Objective: Vital signs in last 24 hours: Temp:  [98 F (36.7 C)-98.4 F (36.9 C)] 98.2 F (36.8 C) (12/21 6269) Pulse Rate:  [78-79] 78 (12/21 0632) Resp:  [16-18] 16 (12/21 0632) BP: (102-131)/(64-86) 131/86 (12/21 0632) SpO2:  [97 %-100 %] 100 % (12/21 4854) Weight:  [48.6 kg] 48.6 kg (12/21 0500) Last BM Date: 05/01/18  Intake/Output from previous day: 12/20 0701 - 12/21 0700 In: -  Out: 1700 [Urine:800; Emesis/NG output:800; Drains:100] Intake/Output this shift: No intake/output data recorded.  Older than stated age, malnourished Some cough but o/w clear Reg Soft, nd, nt, incision c/d/i; J tube intact; drain - tan/murky No edema, +SCDs  Lab Results:  Recent Labs    05/01/18 0302 05/02/18 0223  WBC 6.4 6.6  HGB 8.4* 8.0*  HCT 24.9* 23.8*  PLT 423* 504*   BMET Recent Labs    05/01/18 0302 05/02/18 0223  NA 139 139  K 4.0 3.9  CL 97* 98  CO2 31 30  GLUCOSE 189* 171*  BUN 18 18  CREATININE 0.77 0.79  CALCIUM 8.1* 8.2*   PT/INR No results for input(s): LABPROT, INR in the last 72 hours. ABG No results for input(s): PHART, HCO3 in the last 72 hours.  Invalid input(s): PCO2, PO2  Studies/Results: Dg Abd 1 View  Result Date: 05/01/2018 CLINICAL DATA:  67 year old male with enteric tube placement. Status post total gastrectomy with Roux-en-Y esophagojejunal anastomosis. EXAM: ABDOMEN - 1 VIEW COMPARISON:  CT Abdomen and Pelvis 1016 hours today. FINDINGS: Portable AP supine view at 1522 hours. Enteric tube courses through the esophagus and terminates in the left upper quadrant. When comparing with the CT earlier today the tip is at the level of the anastomosed proximal small bowel loop. Side hole is at the level of the distal esophagus. Negative lung bases. Residual oral contrast in small and large bowel loops  plus 2 postoperative drains. Midline abdominal skin staples. No acute osseous abnormality identified. IMPRESSION: Status post total gastrectomy with Roux-en-Y esophagojejunal anastomosis. Enteric tube placed with tip at the level of the anastomosed proximal small bowel loop (see CT Abdomen and Pelvis earlier today) and side hole at the level of the distal esophagus. Electronically Signed   By: Genevie Ann M.D.   On: 05/01/2018 15:47    Anti-infectives: Anti-infectives (From admission, onward)   Start     Dose/Rate Route Frequency Ordered Stop   04/30/18 1100  vancomycin (VANCOCIN) IVPB 1000 mg/200 mL premix     1,000 mg 200 mL/hr over 60 Minutes Intravenous Every 24 hours 04/30/18 1002     04/30/18 1100  piperacillin-tazobactam (ZOSYN) IVPB 3.375 g     3.375 g 12.5 mL/hr over 240 Minutes Intravenous Every 8 hours 04/30/18 1002     04/22/18 2200  ceFAZolin (ANCEF) IVPB 2g/100 mL premix     2 g 200 mL/hr over 30 Minutes Intravenous Every 8 hours 04/22/18 1935 04/22/18 2214   04/22/18 0600  ceFAZolin (ANCEF) IVPB 2g/100 mL premix     2 g 200 mL/hr over 30 Minutes Intravenous On call to O.R. 04/22/18 0544 04/22/18 1208      Assessment/Plan: S/P total gastrectomy and Roux-en-Y esophagojejunal anastomosis   Protein-calorie malnutrition, severe  s/p Procedure(s): LAPAROSCOPY DIAGNOSTIC ERAS PATHWAY TOTAL GASTRECTOMY DISTAL PANCREATECTOMY SPLENECTOMY INSERTION OF JEJUNOSTOMY TUBE 04/22/2018   CXr with pneumonia,  antibiotics for pneumonia.  will do 5 days of coverage -oxy liquid and prn dilaudid -PT/OT eval rec intermittent supervision with [place]  Send drain for lipase.  Will not pull if elevated.  lipase still pending   ABL anemia -  hgb stable vte prophylaxis - scds, lovenox GI - NGT to stay in for proximal small bowel; Continue J tube feeds at goal.   Endo - BS ok  Leighton Ruff. Redmond Pulling, MD, FACS General, Bariatric, & Minimally Invasive Surgery [facility], [place]   LOS: 11  days    Greer Pickerel 05/03/2018

## 2018-05-03 NOTE — Progress Notes (Signed)
Pharmacy Antibiotic Note  Nathan Massey is a 67 y.o. male admitted on 04/22/2018 for surgery for gastric cancer.  Now with RUL PNA on CXR and Pharmacy has been consulted for vancomycin and Zosyn dosing.  Renal function stable, afebrile, WBC normalized.   Plan: Vanc 1gm IV Q24H for AUC 545 using SCr 0.84 Zosyn EID 3.375gm IV Q8H Monitor renal fxn, clinical progress, de-escalating plans Consider checking MRSA PCR   Height: 5\' 6"  (167.6 cm) Weight: 107 lb 2.3 oz (48.6 kg) IBW/kg (Calculated) : 63.8  Temp (24hrs), Avg:98.2 F (36.8 C), Min:98 F (36.7 C), Max:98.4 F (36.9 C)  Recent Labs  Lab 04/28/18 0606 04/29/18 0226 04/30/18 0204 05/01/18 0302 05/02/18 0223  WBC 7.9 17.1* 11.8* 6.4 6.6  CREATININE 0.77 0.72 0.84 0.77 0.79    Estimated Creatinine Clearance: 60.8 mL/min (by C-G formula based on SCr of 0.79 mg/dL).    No Known Allergies   Vanc 12/18 >> Zosyn 12/18 >>  Tomio Kirk D. Mina Marble, PharmD, BCPS, [place] 05/03/2018, 9:30 AM

## 2018-05-04 LAB — GLUCOSE, CAPILLARY
Glucose-Capillary: 136 mg/dL — ABNORMAL HIGH (ref 70–99)
Glucose-Capillary: 149 mg/dL — ABNORMAL HIGH (ref 70–99)
Glucose-Capillary: 157 mg/dL — ABNORMAL HIGH (ref 70–99)
Glucose-Capillary: 163 mg/dL — ABNORMAL HIGH (ref 70–99)
Glucose-Capillary: 168 mg/dL — ABNORMAL HIGH (ref 70–99)
Glucose-Capillary: 211 mg/dL — ABNORMAL HIGH (ref 70–99)

## 2018-05-04 MED ORDER — WHITE PETROLATUM EX OINT
TOPICAL_OINTMENT | CUTANEOUS | Status: AC
  Administered 2018-05-04: 08:00:00
  Filled 2018-05-04: qty 28.35

## 2018-05-04 NOTE — Progress Notes (Signed)
NG tube clamped per, [place], [place].

## 2018-05-04 NOTE — Plan of Care (Signed)
  Problem: Clinical Measurements: Goal: Respiratory complications will improve Outcome: Progressing   Problem: Activity: Goal: Risk for activity intolerance will decrease Outcome: Progressing   

## 2018-05-04 NOTE — Progress Notes (Signed)
[facility] Progress Note  12 Days Post-Op  Subjective: CC-  Abdominal pain well controlled. States that he is passing more flatus today than he has in the past several days. Blake drain broke over night. Denies SOB or cough. Pulling 1500 on IS.  Objective: Vital signs in last 24 hours: Temp:  [97.6 F (36.4 C)-98 F (36.7 C)] 97.6 F (36.4 C) (12/22 0433) Pulse Rate:  [72-76] 76 (12/22 0433) Resp:  [16] 16 (12/22 0433) BP: (121-135)/(78-90) 122/78 (12/22 0433) SpO2:  [98 %-100 %] 98 % (12/22 0433) Last BM Date: 05/03/18  Intake/Output from previous day: 12/21 0701 - 12/22 0700 In: 2763.2 [I.V.:120; NG/GT:2565.8; IV Piggyback:77.4] Out: 675 [Urine:500; Emesis/NG output:100; Drains:75] Intake/Output this shift: No intake/output data recorded.  PE: Gen:  Alert, NAD, pleasant HEENT: EOM's intact, pupils equal and round Card:  RRR Pulm:  CTAB, no W/R/R, effort normal, pulling 1500 on IS Abd: midline incision cdi with staples intact, soft, NT/ND, +BS, J tube site clean; surgical blake drake broken Skin: no rashes noted, warm and dry  Lab Results:  Recent Labs    05/02/18 0223  WBC 6.6  HGB 8.0*  HCT 23.8*  PLT 504*   BMET Recent Labs    05/02/18 0223  NA 139  K 3.9  CL 98  CO2 30  GLUCOSE 171*  BUN 18  CREATININE 0.79  CALCIUM 8.2*   PT/INR No results for input(s): LABPROT, INR in the last 72 hours. CMP     Component Value Date/Time   NA 139 05/02/2018 0223   K 3.9 05/02/2018 0223   CL 98 05/02/2018 0223   CO2 30 05/02/2018 0223   GLUCOSE 171 (H) 05/02/2018 0223   BUN 18 05/02/2018 0223   CREATININE 0.79 05/02/2018 0223   CALCIUM 8.2 (L) 05/02/2018 0223   PROT 6.8 04/16/2018 1142   ALBUMIN 3.9 04/16/2018 1142   AST 19 04/16/2018 1142   ALT 17 04/16/2018 1142   ALKPHOS 61 04/16/2018 1142   BILITOT 0.7 04/16/2018 1142   GFRNONAA >60 05/02/2018 0223   GFRAA >60 05/02/2018 0223   Lipase  No results found for:  LIPASE     Studies/Results: No results found.  Anti-infectives: Anti-infectives (From admission, onward)   Start     Dose/Rate Route Frequency Ordered Stop   04/30/18 1100  vancomycin (VANCOCIN) IVPB 1000 mg/200 mL premix     1,000 mg 200 mL/hr over 60 Minutes Intravenous Every 24 hours 04/30/18 1002 05/04/18 2359   04/30/18 1100  piperacillin-tazobactam (ZOSYN) IVPB 3.375 g     3.375 g 12.5 mL/hr over 240 Minutes Intravenous Every 8 hours 04/30/18 1002 05/04/18 2359   04/22/18 2200  ceFAZolin (ANCEF) IVPB 2g/100 mL premix     2 g 200 mL/hr over 30 Minutes Intravenous Every 8 hours 04/22/18 1935 04/22/18 2214   04/22/18 0600  ceFAZolin (ANCEF) IVPB 2g/100 mL premix     2 g 200 mL/hr over 30 Minutes Intravenous On call to O.R. 04/22/18 0544 04/22/18 1208       Assessment/Plan S/P total gastrectomy and Roux-en-Y esophagojejunal anastomosis Protein-calorie malnutrition, severe  s/p Procedure(s): LAPAROSCOPY DIAGNOSTIC ERAS PATHWAY TOTAL GASTRECTOMY DISTAL PANCREATECTOMY SPLENECTOMY INSERTION OF JEJUNOSTOMY TUBE 04/22/2018   CXr with pneumonia,antibiotics for pneumonia: day 5/5 abx -oxy liquid and prn dilaudid -PT/OT eval rec intermittent supervision with [place]  Send drain for lipase (result still pending). Will not pull if elevated   ABL anemia - hgb 8(12/20) stable  ID - vancomycin/zosyn 12/18>>12/22 FEN -  clamp NG tube, TF at goal VTE - scds, lovenox Foley - none Follow up - Dr. Barry Dienes  Plan - Clamp NG tube, unclamp if patient becomes nauseated. Surgical Blake drain broke last night, will work on getting replacement. Labs in AM.   LOS: 12 days    Wellington [place] , [facility] 05/04/2018, 9:51 AM Pager: [phone] Mon 7:00 am -11:30 AM Tues-Fri 7:00 am-4:30 pm Sat-Sun 7:00 am-11:30 am

## 2018-05-05 LAB — CBC
HCT: 24.2 % — ABNORMAL LOW (ref 39.0–52.0)
Hemoglobin: 8.1 g/dL — ABNORMAL LOW (ref 13.0–17.0)
MCH: 30.9 pg (ref 26.0–34.0)
MCHC: 33.5 g/dL (ref 30.0–36.0)
MCV: 92.4 fL (ref 80.0–100.0)
Platelets: 667 10*3/uL — ABNORMAL HIGH (ref 150–400)
RBC: 2.62 MIL/uL — ABNORMAL LOW (ref 4.22–5.81)
RDW: 14.2 % (ref 11.5–15.5)
WBC: 4.7 10*3/uL (ref 4.0–10.5)
nRBC: 0 % (ref 0.0–0.2)

## 2018-05-05 LAB — COMPREHENSIVE METABOLIC PANEL
ALT: 47 U/L — ABNORMAL HIGH (ref 0–44)
AST: 52 U/L — ABNORMAL HIGH (ref 15–41)
Albumin: 2.2 g/dL — ABNORMAL LOW (ref 3.5–5.0)
Alkaline Phosphatase: 81 U/L (ref 38–126)
Anion gap: 12 (ref 5–15)
BUN: 15 mg/dL (ref 8–23)
CO2: 30 mmol/L (ref 22–32)
Calcium: 8.4 mg/dL — ABNORMAL LOW (ref 8.9–10.3)
Chloride: 96 mmol/L — ABNORMAL LOW (ref 98–111)
Creatinine, Ser: 0.65 mg/dL (ref 0.61–1.24)
GFR calc Af Amer: >60 mL/min (ref >60)
GFR calc non Af Amer: >60 mL/min (ref >60)
Glucose, Bld: 171 mg/dL — ABNORMAL HIGH (ref 70–99)
Potassium: 3.8 mmol/L (ref 3.5–5.1)
Sodium: 138 mmol/L (ref 135–145)
Total Bilirubin: 0.6 mg/dL (ref 0.3–1.2)
Total Protein: 5.3 g/dL — ABNORMAL LOW (ref 6.5–8.1)

## 2018-05-05 LAB — GLUCOSE, CAPILLARY
Glucose-Capillary: 126 mg/dL — ABNORMAL HIGH (ref 70–99)
Glucose-Capillary: 137 mg/dL — ABNORMAL HIGH (ref 70–99)
Glucose-Capillary: 148 mg/dL — ABNORMAL HIGH (ref 70–99)
Glucose-Capillary: 163 mg/dL — ABNORMAL HIGH (ref 70–99)
Glucose-Capillary: 193 mg/dL — ABNORMAL HIGH (ref 70–99)
Glucose-Capillary: 198 mg/dL — ABNORMAL HIGH (ref 70–99)

## 2018-05-05 NOTE — Care Management (Signed)
Received a call from Maxwell Marion ( Dietitian at [facility]) phone [phone] . [facility] , formula and supplies to patient's [place] address.  Patient asked Ms Mel Almond to send everything to his wife's address. Patient did not know his wife's address. Patient gave Ms Mel Almond his wife's phone number , however when  Ms Mel Almond calls number no answer and no voicemail. Per Ms Mel Almond patient requested tube feeds to be delivered to his address and he will have wife go by and pick it up.   Confirmed above with patient. He states his wife will pick up tube feeding pump, formula and supplies from his home today. Patient states his wife does not live with him but is going to stay with him at his address at discharge and assist him. Per patient wife will come to hospital to visit today. Reminded patient that bedside nurse will begin teaching with him and his wife today . Bedside nurse aware.  [name] RN BSN [phone]

## 2018-05-05 NOTE — Progress Notes (Signed)
OT Cancellation Note  Patient Details Name: Nathan Massey MRN: [id number] DOB: 17-Mar-1951   Cancelled Treatment:    Reason Eval/Treat Not Completed: Patient declined, no reason specified Pt refusing to ambulate or transfer to chair at this time as he was just up to bathroom not long ago. Pt reports that he may go home tomorrow. OT to continue to follow-up with pt acutely.  Fredda Hammed 05/05/2018, 2:29 PM

## 2018-05-05 NOTE — Progress Notes (Signed)
Nutrition Follow-up  DOCUMENTATION CODES:   Severe malnutrition in context of chronic illness  INTERVENTION:   -Continue Vital AF 1.2@ 48m/hr via j-tube  Tube feeding regimen provides1584kcal (100% of needs),99grams of protein, and 10762mof H2O.   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  The [facility] with [facility]ph[facility].  Work-up showed a 9 cm mass at the GE junction.  12/10- s/pTOTAL GASTRECTOMY;DISTAL PANCREATECTOMY;SPLENECTOMY; INSERTION OF JEJUNOSTOMY TUBE 12/11- trickle feeds initiated at 10 ml/hr 12/12- NGT d/c 12/13- transferred to surgical floor 12/16- TF advanced to goal rate of 55 ml/hr, advanced to full liquid diet 12/19- NPO, s/p CT with contract, NGT to decompression due to vomiting 12/23- NGT removed, advanced to clear liquids  Reviewed I/O's: +2.0 ml x 24 hours and +7.0 L since admission  Drains: 20 ml output x 24 hours  Pt resting quietly at time of visit. Noted NGT out.   Vital AF 1.2 infusing via j-tube @ 55 ml/hr. Regimen providing1584kcal,99grams of protein, and 107019mf H2O, which meets 100% of estimated kcals and protein needs.   Per RNCM notes, arrangement have been made for home TFs once pt is stable for discharge.  Labs reviewed: CBGS: 148-198 (inpatient orders for glycemic control are 0-9 units insulin aspart every 4 hours).   Diet Order:   Diet Order           Diet clear liquid Room service appropriate? Yes; Fluid consistency: Thin  Diet effective now              EDUCATION NEEDS:   No education needs have been identified at this time  Skin:  Skin Assessment: Skin Integrity Issues: Skin Integrity Issues:: Incisions Incisions: surgical; abdominal  Last BM:  05/05/18  Height:   Ht Readings from Last 1 Encounters:  04/25/18 _0  (1.676 m)    Weight:   Wt Readings from Last 1 Encounters:  05/03/18 48.6 kg    Ideal Body Weight:  64.5 kg  BMI:  Body mass index is 17.29 kg/m.  Estimated Nutritional Needs:   Kcal:  1600-1800  Protein:  90-105 gm  Fluid:  1.6-1.8 L    Glora Hulgan A. WilJimmye NormanD, LDN, CDE Pager: [phone] hours Pager: [phone]

## 2018-05-05 NOTE — Care Management (Signed)
Provided update to [place] at  [facility], fax fax [phone]. [facility] Harries will be providing home health services.     Provided update to  [name] RN at [facility] phone [phone] ext [phone] fax [phone].  [name] RN BSN [phone]

## 2018-05-05 NOTE — Progress Notes (Signed)
PT Cancellation Note  Patient Details Name: Nathan Massey MRN: [id number] DOB: [birth year]   Cancelled Treatment:    Reason Eval/Treat Not Completed: (P) Patient declined, no reason specified Pt refused to get up with therapy today. Pt states "I'm doing alright, I've already been up today.  [name] B. Migdalia Dk PT, DPT Acute Rehabilitation Services Pager [phone] Office [phone]    [place] 05/05/2018, 2:07 PM

## 2018-05-05 NOTE — Progress Notes (Signed)
13 Days Post-Op    CC:  Subjective: Tolerated NG tube clamping without nausea or vomiting.  Says he wants to eat some ice cream.  Had a bowel movement.  Pain well controlled.  Ambulated in hall.  Keenan Bachelor drain is been repaired purulent drainage persists,  But only 20 cc out overnight.  Tolerating GJ feeds.  Hemoglobin 8.1.  WBC 4700.  AST and ALT slightly elevated.  Creatinine 0.65.  Potassium 3.8.  Drain functioning normal to gravity drainage.  Drain to gravity.  Purulent.     Objective: Vital signs in last 24 hours: Temp:  [97.6 F (36.4 C)-98.7 F (37.1 C)] 98.6 F (37 C) (12/23 0605) Pulse Rate:  [66-75] 70 (12/23 0605) Resp:  [16-17] 16 (12/23 0605) BP: (103-149)/(66-89) 103/66 (12/23 0605) SpO2:  [99 %-100 %] 99 % (12/23 0605) Last BM Date: 05/04/18 N.p.o. Tube feeding 1355 IV fluids 760 Urine 100 recorded Drains 20 Stool x1 Afebrile vital signs are stable. Glucose remains elevated. WBC 4.7, Hemoglobin 8.1/hematocrit 24.2.  Intake/Output from previous day: 12/22 0701 - 12/23 0700 In: 2119.7 [I.V.:210; NG/GT:1355.7; IV Piggyback:554] Out: 120 [Urine:100; Drains:20] Intake/Output this shift: No intake/output data recorded.    PE: Gen:  Alert, NAD, pleasant HEENT: EOM's intact, pupils equal and round Card:  RRR Pulm:  CTAB, no W/R/R, effort normal, pulling 1500 on IS Abd: midline incision cdi with staples intact, soft, NT/ND, +BS, J tube site clean;   Skin: no rashes noted, warm and dry   Lab Results:  Recent Labs    05/05/18 0137  WBC 4.7  HGB 8.1*  HCT 24.2*  PLT 667*    BMET Recent Labs    05/05/18 0137  NA 138  K 3.8  CL 96*  CO2 30  GLUCOSE 171*  BUN 15  CREATININE 0.65  CALCIUM 8.4*   PT/INR No results for input(s): LABPROT, INR in the last 72 hours.  Recent Labs  Lab 05/05/18 0137  AST 52*  ALT 47*  ALKPHOS 81  BILITOT 0.6  PROT 5.3*  ALBUMIN 2.2*     Lipase  No results found for: LIPASE   Medications: . enoxaparin  (LOVENOX) injection  40 mg Subcutaneous Q24H  . insulin aspart  0-9 Units Subcutaneous Q4H  . zolpidem  5 mg Oral QHS   . dextrose 5 % and 0.45% NaCl 10 mL/hr at 05/05/18 0539  . feeding supplement (VITAL AF 1.2 CAL) 1,000 mL (05/05/18 0501)  . methocarbamol (ROBAXIN) IV 500 mg (05/01/18 2147)   Anti-infectives (From admission, onward)   Start     Dose/Rate Route Frequency Ordered Stop   04/30/18 1100  vancomycin (VANCOCIN) IVPB 1000 mg/200 mL premix     1,000 mg 200 mL/hr over 60 Minutes Intravenous Every 24 hours 04/30/18 1002 05/04/18 1225   04/30/18 1100  piperacillin-tazobactam (ZOSYN) IVPB 3.375 g     3.375 g 12.5 mL/hr over 240 Minutes Intravenous Every 8 hours 04/30/18 1002 05/04/18 2242   04/22/18 2200  ceFAZolin (ANCEF) IVPB 2g/100 mL premix     2 g 200 mL/hr over 30 Minutes Intravenous Every 8 hours 04/22/18 1935 04/22/18 2214   04/22/18 0600  ceFAZolin (ANCEF) IVPB 2g/100 mL premix     2 g 200 mL/hr over 30 Minutes Intravenous On call to O.R. 04/22/18 0544 04/22/18 1208        1. Liver, biopsy - BILE DUCT HAMARTOMA. - NO CARCINOMA IDENTIFIED. 2. Stomach, resection for tumor, and distal pancreas and spleen - ADENOCARCINOMA OF STOMACH, STATUS  POST NEOADJUVANT TREATEMENT. - TUMOR INVADES ADJACENT STRUCTURES/ORGANS (DISTAL PANCREAS). - MARGINS OF RESECTION ARE NOT INVOLVED. 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 Will not pull   .   Discontinue NG tube.  Allow clear liquids today the C NG tube.  Discontinue NG tube review x-rays.  Possibly discontinue antibiotics  ABL anemia -hgb 8.1(12/23) stable  ID - vancomycin/zosyn 12/18>>12/22 FEN -, TF at goal 55 ml/hr;   Discontinue NG tube.  Allow clear liquids today VTE - scds, lovenox Foley - none Follow up - Dr. Barry Dienes      LOS: 13 days    Nathan Massey 05/05/2018 [phone]

## 2018-05-06 LAB — LIPASE, FLUID: Lipase-Fluid: 5940 U/L

## 2018-05-06 LAB — GLUCOSE, CAPILLARY
Glucose-Capillary: 127 mg/dL — ABNORMAL HIGH (ref 70–99)
Glucose-Capillary: 139 mg/dL — ABNORMAL HIGH (ref 70–99)
Glucose-Capillary: 162 mg/dL — ABNORMAL HIGH (ref 70–99)
Glucose-Capillary: 182 mg/dL — ABNORMAL HIGH (ref 70–99)
Glucose-Capillary: 186 mg/dL — ABNORMAL HIGH (ref 70–99)
Glucose-Capillary: 214 mg/dL — ABNORMAL HIGH (ref 70–99)

## 2018-05-06 NOTE — Progress Notes (Signed)
Physical Therapy Treatment Patient Details Name: Nathan Massey MRN: [id number] DOB: [birth year] Today's Date: 05/06/2018    History of Present Illness  Patient is a 67 year old male referred by Dr. Servando Snare for diagnosis of adenocarcinoma of the GE junction. 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 Overall pt is mobilizing well with supervision to modified independence, however safety awareness is poor. Pt requires assist for safe mobility with IV pole and other lines, however recognize pt will not have to worry about an IV line at home. Increased encouragement provided for pt to participate and he was educated on benefits of frequent mobility. Will continue to follow and progress as able per POC.   Follow Up Recommendations  Home health PT;Supervision - Intermittent     Equipment Recommendations  Rolling walker with 5" wheels;3in1 (PT)    Recommendations for Other Services       Precautions / Restrictions Precautions Precautions: Fall Precaution Comments: Abdominal surgery Restrictions Weight Bearing Restrictions: No    Mobility  Bed Mobility Overal bed mobility: Modified Independent Bed Mobility: Supine to Sit;Sit to Supine Rolling: Supervision       Sit to sidelying: Supervision General bed mobility comments: Pt was able to transition to/from EOB without difficulty. Increased time to manage bed linens due to multiple lines, however no assist required.   Transfers Overall transfer level: Needs assistance Equipment used: Rolling walker (2 wheeled);None Transfers: Sit to/from Stand Sit to Stand: Supervision         General transfer  comment: Supervision for safety only to manage IV pole and lines. No unsteadiness or LOB noted for sit<>stand.   Ambulation/Gait Ambulation/Gait assistance: Supervision Gait Distance (Feet): 250 Feet Assistive device: Rolling walker (2 wheeled);None Gait Pattern/deviations: Decreased step length - right;Decreased step length - left;Trunk flexed Gait velocity: Decreased Gait velocity interpretation: 1.31 - 2.62 ft/sec, indicative of limited community ambulator General Gait Details: Pt initially ambulating in room without AD. When we moved to hallway ambulation, pt requesting RW for support. Several cues given for closer walker proximity as well as to be aware of IV pole as he was quick to change directions and pull away from IV pole.    Stairs Stairs: Yes Stairs assistance: Min guard Stair Management: One rail Right;Alternating pattern;Forwards Number of Stairs: 4 General stair comments: Decreased safety awareness in regards to IV pole/lines. Number of stairs limited by line length but no unsteadiness/LOB noted with stair negotiation.   Wheelchair Mobility    Modified Rankin (Stroke Patients Only)       Balance Overall balance assessment: Needs assistance Sitting-balance support: Feet supported Sitting balance-Leahy Scale: Good     Standing balance support: Bilateral upper extremity supported Standing balance-Leahy Scale: Fair                              Cognition Arousal/Alertness: Awake/alert Behavior During Therapy: WFL for tasks assessed/performed Overall Cognitive Status: Within Functional Limits for tasks assessed                                        Exercises  General Comments        Pertinent Vitals/Pain Pain Assessment: No/denies pain    Home Living                      Prior Function            PT Goals (current goals can now be found in the care plan section) Acute Rehab PT Goals Patient Stated Goal:  less pain PT Goal Formulation: With patient Time For Goal Achievement: 05/11/18 Potential to Achieve Goals: Good Progress towards PT goals: Progressing toward goals    Frequency    Min 3X/week      PT Plan Current plan remains appropriate    Co-evaluation              AM-PAC PT "6 Clicks" Mobility   Outcome Measure  Help needed turning from your back to your side while in a flat bed without using bedrails?: None Help needed moving from lying on your back to sitting on the side of a flat bed without using bedrails?: None Help needed moving to and from a bed to a chair (including a wheelchair)?: None Help needed standing up from a chair using your arms (e.g., wheelchair or bedside chair)?: None Help needed to walk in hospital room?: A Little Help needed climbing 3-5 steps with a railing? : A Little 6 Click Score: 22    End of Session Equipment Utilized During Treatment: Gait belt Activity Tolerance: Patient tolerated treatment well Patient left: in bed;with call bell/phone within reach Nurse Communication: Mobility status PT Visit Diagnosis: Unsteadiness on feet (R26.81);Other abnormalities of gait and mobility (R26.89)     Time: 4696-2952 PT Time Calculation (min) (ACUTE ONLY): 16 min  Charges:  $Gait Training: 8-22 mins                     Rolinda Roan, PT, DPT Acute Rehabilitation Services Pager: [phone] Office: [phone]    Nathan Massey 05/06/2018, 12:59 PM

## 2018-05-06 NOTE — Discharge Instructions (Addendum)
TAKE TYLENOL FOR PAIN FIRST, 650 - 1,000 mg EVERY 6-8 HOURS AS NEEDED. IF YOUR PAIN IS NOT CONTROLLED, THEN TAKE OXYCODONE 5-10 mg EVERY 4 HOURS AS NEEDED.  Gastric Surgery Nutrition Therapy Your surgery has changed how your stomach and intestines work. Your dietitian will help you understand what foods and drinks are best for you. The amount and types of foods you eat may cause you to experience nausea, diarrhea, or other symptoms. Do not eat foods that have a lot of sugar in them. Have drinks between your meals (not with your meals) and eat the recommended foods. After you recover from your surgery, some foods (in small amounts) may be slowly added to what you eat each day. Foods Recommended While you recover, you should: Eat very small meals and snacks. Avoid foods that have lots of sugar. Have drinks between meals (not with meals or snacks). Avoid the foods listed on the Foods Not Recommended chart in this handout (A registered dietitian can help you choose foods that are best for you). Have a protein food (such as meat, cheese, or eggs) at every meal. Choose soft and well-cooked foods. Choose grain foods made with white or refined fiber. Choices should have less than 2 grams (g) fiber per serving. After surgery, you will start eating solid food by trying one or two foods per meal. One of these foods should be a protein food. During the recovery period, you can slowly add more foods (in small amounts) to your daily eating plan. In time, you will be eating between six and eight small meals and snacks each day. While you recover, you should not drink beverages with meals. Instead, you must wait 30 to 60 minutes after you eat solid food before you have a beverage. The following chart lists the best food choices for the recovery period of 6 to 8 weeks after your surgery. Food Group Foods Recommended Notes Milk and Milk Products Buttermilk Evaporated , skim, and 1% fat milk Soy milk  with no added sugar Yogurt with no added sugar Powdered milk Cheese Low-fat , low-sugar ice cream Choose lactose-free products if you have lactose intolerance after surgery. (If you have this condition, you will have symptoms after drinking regular milk or eating foods made from milk. Symptoms include diarrhea, nausea, stomach pain, and bloating.) If you eat yogurt, choose ones that include live, active cultures. (The food label will list this information.) Do not drink milk or o ther beverages with meals or snacks. After eating solid foods, wait 30 to 60 minutes before having a beverage. Copyright Academy of Nutrition and Dietetics. This handout may be duplicated for client education. Page 2/5 Meat and Other Protein Foods Tender, well-cooked meats, poultry, fish, eggs, or soy foods prepared without added fat Smooth n ut butters Make sure to include a protein food in every meal and snack. Grains White flour Bread, bage ls, rolls, crackers, and pasta made from white or refined flour Cold or hot cereals made from white or refined flour Choose grain foods with less than 2 g fiber per serving. (The grams o f dietary fiber in one serving are listed on the Nutrition Facts label of packaged foods.) Choose cereals that have no added suga r. Vegetables Most well-cooked vegetables without seeds or skins Potatoes without skin Lettuce Strained vegetable juice See the Foods Not Recommended chart for specific vegetables to avoid. Fruits Canned, soft fruits without added sugar Bananas, me lon Fats Oils, butter, margarine Cream, cream cheese Mayonnaise Beverages  Decaffeinated coffee Caffeine-free tea Sugar-free soft d rinks without caffeine After eating solid foods, wait 30 to 60 minutes before having a beverage. Do not have beverages with meals. Sweeten coffee or tea with a rtificial sweeteners only. Other Any allowed foods made with artificial sweeteners Allowed artificial  sweeteners include saccharin (Sweet N Low), aspartame (Equal, NutraSweet), sucralose (Splenda), and acesulfame potassium (Sunette, SweetOne). Foods Not Recommended Food Group Foods to Limit or Avoid Milk and Milk Products Chocolate milk Other milk food s made with added sugar If you have lactose intolerance, avoid reg ular milk and foods made with regular milk. Choose lactose-free products or soy milk instead. Do not drink milk or other beverages with meals or snacks. After eating solid foods, wait 30 to 60 minutes before having a beverage. Meat and Other Protein Foods Fried meat, Sales executive, or fish [employer], such as b Financial planner, hot dogs, bacon Tough or chewy meats Dried beans and peas, such as pinto or kidney beans Nuts, chunky nut butters Copyright Academy of Nutrition and Dietetics. This handout may be duplicated for client education. Page 3/5 Grains Whole grain flour Breads, bagels, ro lls, crackers, and pasta with more than 2 grams of fiber per serving or made from whole grain flour Cold or hot cereals wit h more than 2 grams of fiber per serving or made from whole grains Cereals with added sugar Vegetables All raw vegetables except lettuce Any cooked vegetables served wi th skins or seeds Beets Brocc oli, brussels sprouts, cabbage Cauliflower Collards, m ustard, and turnip greens Corn Potat o skins Fruits All raw fruits except banana and melons Dried fruits including prunes and raisins Fruit juice Canned fr uit in sugar or syrup Beverages Caffeinated coffee or tea Alcoholic beverages Beverages made wit h sugar, corn syrup, or honey Fruit juices and fruit drinks Do not drink beverages wit h meals or snacks. After eating solid foods, wait 30 to 60 minutes before having a beverage. Other Sugar Honey , syrup Sorbitol, xylito l Foods that list sugar, honey, syrup, xylitol, or sorbitol as one of the first three ingredients on the food  label Copyright Academy of Nutrition and Dietetics. This handout may be duplicated for client education. Page 4/5 Gastric Surgery Sample 1-Day Menu Breakfast 1 scrambled egg 1 slice white toast 2 teaspoons margarine 1 cup decaf coffee (30-60 minutes after breakfast) Morning Snack 2 oz cheddar cheese 6 saltine crackers 1/2 cup canned peaches, no added sugar 1 cup soy milk, no added sugar (30-60 min after) Lunch 1/2 cup tuna salad 6 saltine crackers 12 oz sugar-free soda (30-60 minutes after lunch) 1 slice white bread Afternoon Snack 1 cup yogurt without added sugar Evening Meal 5 oz roast beef 1 cup mashed potatoes 1 cup green beans 1 cup caffeine-free tea (30-60 minutes after meal) Evening Snack 1/2 plain bagel 2 tablespoons cream cheese 1 cup soy milk, no added sugar (30-60 min after) Gastric Surgery Vegetarian (Lacto-Ovo) Sample 1-Day Menu Breakfast 1 scrambled egg 1 slice white toast 2 teaspoons margarine, soft, tub 1 cup decaffeinated coffee (30-60 min after breakfast) Morning Snack 6 saltine crackers 2 ounces cheddar cheese  cup canned peaches, in juice 1 cup 1% milk (30-60 minutes after snack) Lunch 2 slices white bread 2 tablespoons smooth peanut butter 1 cup cantaloupe 1 cup green tea (30-60 minutes after lunch) Afternoon Snack 1 cup plain yogurt Evening Meal  cup meatless chicken 1 cup mashed potatoes 1 cup cooked green beans 2 teaspoons olive oil 1  cup decaffeinated tea (30-60 minutes after dinner) Evening Snack  ounce pretzels 1 cup 1% milk (30-60 minutes after snack) Copyright Academy of Nutrition and Dietetics. This handout may be duplicated for client education. Page 5/5 Gastric Surgery Vegan Sample 1-Day Menu Breakfast 1/3 cup tofu scramble 1 slice white toast 2 teaspoons margarine, soft, tub 1 cup decaffeinated coffee (30-60 min after breakfast) Morning Snack 6 saltine crackers 1 tablespoon smooth almond butter  cup canned peaches, in  juice  cup unsweetened soymilk fortified with calcium, vitamin B12, and vitamin D (30-60 minutes after snack) Lunch 2 slices white bread 2 tablespoons smooth peanut butter 1 small banana 1 cup green tea (30-60 minutes after lunch) Afternoon Snack 6 ounces plain soy yogurt Evening Meal  cup meatless chicken 1 cup mashed potatoes 1 cup cooked green beans 2 teaspoons olive oil 1 cup decaffeinated tea (30-60 minutes after dinner) Evening Snack  ounce pretzels 1 cup unsweetened soymilk fortified with calcium, vitamin B12, and vitamin D (30-60 minutes after snack)

## 2018-05-06 NOTE — Progress Notes (Signed)
Nutrition Follow-up  DOCUMENTATION CODES:   Severe malnutrition in context of chronic illness  INTERVENTION:   -Continue Vital AF 1.2@ 15m/hr via j-tube  Tube feeding regimen provides1584kcal (100% of needs),99grams of protein, and 10750mof H2O.  -RD will follow for diet advancement and supplement as appropriate  -RD provided "Gastric Surgery Nutrition Therapy" handout from [facility] Nutrition Care Manual  NUTRITION DIAGNOSIS:   Severe Malnutrition related to chronic illness(gastric cancer) as evidenced by severe fat depletion, moderate fat depletion.  Ongoing  GOAL:   Patient will meet greater than or equal to 90% of their needs  Met with TF  MONITOR:   Diet advancement, TF tolerance, Labs, Weight trends, Skin, I & O's  REASON FOR ASSESSMENT:   Consult Assessment of nutrition requirement/status  ASSESSMENT:   [age]o Male with diagnosis of adenocarcinoma of the GE junction.  [facility].  Work-up showed a 9 cm mass at the GE junction.  12/10- s/pTOTAL GASTRECTOMY;DISTAL PANCREATECTOMY;SPLENECTOMY; INSERTION OF JEJUNOSTOMY TUBE 12/11- trickle feeds initiated at 10 ml/hr 12/12- NGT d/c 12/13- transferred to surgical floor 12/16- TF advanced to goal rate of 55 ml/hr, advanced to full liquid diet 12/19- NPO, s/p CT with contract, NGT to decompression due to vomiting 12/23- NGT removed, advanced to clear liquids  Reviewed I/O'sL +398 ml x 24 hours and +8.1 L since admission  Drain output: 50 ml x 24 hours  RD re-consulted to provide education on post-gastrectomy diet. Per MD notes, pt would like all lines and tubes removed prior to discharge. However, given pt's minimal intake and severe malnutrition, pt would likely be unable to meet nutritional needs without assistance of nutrition support, especially if pt remains on clear liquids.   Pt remains on clear liquid diet; pt intake has improved  since yesterday- noted he consumed 100% of clear liquid diet from breakfast per observation of tray.   Pt was sleeping at time of visit with entire body under blankets at time of visit. Pt briefly removed covers to expose his head when RD entered the room, however, covered his head back up when this RD tried to engage pt. No family members or caregivers present at this time. RD provided "Gatric Surgery Nutrition Therapy" [facility]out [facility] with RD contact information.   Vital AF 1.2 infusing via j-tube @ 55 ml/hr. Regimen providing1584kcal,99grams of protein, and 107052mf H2O, which meets 100% of estimated kcals and protein needs.  Per RNCM notes, arrangement have been made for home TFs once pt is stable for discharge.  Labs reviewed: CBGS: 126-182 (inpatient orders for glycemic control are 0-9 units insulin aspart every 4 hours).   Diet Order:   Diet Order            Diet clear liquid Room service appropriate? Yes; Fluid consistency: Thin  Diet effective now              EDUCATION NEEDS:   No education needs have been identified at this time  Skin:  Skin Assessment: Skin Integrity Issues: Skin Integrity Issues:: Incisions Incisions: surgical; abdominal  Last BM:  05/05/18  Height:   Ht Readings from Last 1 Encounters:  04/25/18 5' 6" (1.676 m)    Weight:   Wt Readings from Last 1 Encounters:  05/03/18 48.6 kg    Ideal Body Weight:  64.5 kg  BMI:  Body mass index is 17.29 kg/m.  Estimated Nutritional Needs:   Kcal:  1600-1800  Protein:  90-105 gm  Fluid:  1.6-1.8 L    Jenifer A. Jimmye Norman, RD, LDN, CDE Pager: [phone] After hours Pager: [phone]

## 2018-05-06 NOTE — Care Management (Signed)
[place] has arranged home health services through  [facility].  [place] needs to be notified of discharge date . [facility].   NCM has also ordered walker and 3 in 1 through [place].   Faxed update to [place] [facility] [place] [facility] [place], faxfax [phone].    Provided update Chief Operating Officer at [facility] phone [phone] fax [phone].  [name] RN BSN [phone]

## 2018-05-06 NOTE — Progress Notes (Signed)
14 Days Post-Op  Subjective: I appreciate all of the input and advice from nutrition services, PT, OT, and case management  Patient doing well with clear liquid diet and will advance to full liquids today. Having loose stools Says he can eat regular food  He says he wants to have all of his tubes out before he goes home.  I told him this may not be practical  50 cc out abscess drain over 24 hours.  Purulent  Objective: Vital signs in last 24 hours: Temp:  [98.2 F (36.8 C)-98.7 F (37.1 C)] 98.7 F (37.1 C) (12/24 0516) Pulse Rate:  [68-74] 73 (12/24 0516) Resp:  [16] 16 (12/24 0516) BP: (112-126)/(71-76) 119/72 (12/24 0516) SpO2:  [95 %-100 %] 99 % (12/24 0516) Last BM Date: 05/05/18  Intake/Output from previous day: 12/23 0701 - 12/24 0700 In: 847.7 [P.O.:240; I.V.:93.5; NG/GT:514.3] Out: 450 [Urine:400; Drains:50] Intake/Output this shift: No intake/output data recorded.   PE: Gen: Alert, NAD, pleasant HEENT: EOM's intact, pupils equal and round Card: RRR Pulm: CTAB, no W/R/R, effort normal, pulling 1500 on IS VPX:TGGYIRS incision cdi with staples intact, soft, NT/ND, +BS,J tube site clean; drainage purulent.  50 cc out in 24 hours  Skin: no rashes noted, warm and dry   Lab Results:  Results for orders placed or performed during the hospital encounter of 04/22/18 (from the past 24 hour(s))  Glucose, capillary     Status: Abnormal   Collection Time: 05/05/18  8:25 AM  Result Value Ref Range   Glucose-Capillary 148 (H) 70 - 99 mg/dL  Glucose, capillary     Status: Abnormal   Collection Time: 05/05/18 12:12 PM  Result Value Ref Range   Glucose-Capillary 198 (H) 70 - 99 mg/dL  Glucose, capillary     Status: Abnormal   Collection Time: 05/05/18  4:13 PM  Result Value Ref Range   Glucose-Capillary 137 (H) 70 - 99 mg/dL  Glucose, capillary     Status: Abnormal   Collection Time: 05/05/18  8:05 PM  Result Value Ref Range   Glucose-Capillary 126 (H) 70 - 99  mg/dL  Glucose, capillary     Status: Abnormal   Collection Time: 05/06/18 12:09 AM  Result Value Ref Range   Glucose-Capillary 186 (H) 70 - 99 mg/dL  Glucose, capillary     Status: Abnormal   Collection Time: 05/06/18  4:24 AM  Result Value Ref Range   Glucose-Capillary 139 (H) 70 - 99 mg/dL  Glucose, capillary     Status: Abnormal   Collection Time: 05/06/18  7:57 AM  Result Value Ref Range   Glucose-Capillary 162 (H) 70 - 99 mg/dL     Studies/Results: No results found.  . enoxaparin (LOVENOX) injection  40 mg Subcutaneous Q24H  . insulin aspart  0-9 Units Subcutaneous Q4H  . 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 Will not pull   Advance to full liquid diet We will ask nutritional services to begin formulating a postgastrectomy diet for him in hopes  that we can slowly transition off of tube feedings .  discontinue antibiotics  ABL anemia -hgb8.1(12/23)stable  ID -vancomycin/zosyn 12/18>>12/23--dc FEN -, TF at goal 55 ml/hr;  NG tube removed 1223.  Advance to full liquids today. VTE -scds, lovenox Foley -none Follow up -Dr. Barry Dienes Disposition-home to [place].  Case management working on home health services   @PROBHOSP @  LOS: 14 days    Adin Hector 05/06/2018  . .prob

## 2018-05-06 NOTE — Progress Notes (Signed)
Occupational Therapy Treatment Patient Details Name: Nathan Massey MRN: [id number] DOB: 31-Jul-1950 Today's Date: 05/06/2018    History of present illness  Patient is a 67 year old male referred by Dr. Servando Snare for diagnosis of adenocarcinoma of the GE junction. 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 Pt supervisionA for bed mobility; transfers with supervision and adl functional mobility with RW in hallway a community distance with [place]. Pt denying need to perform ADLs at this time. Pt donning gown with Mod I and donning socks with set-upA  With crossed leg technique. Pt will require HHOT for ADLs, mobility and safety.    Follow Up Recommendations  Home health OT;Supervision - Intermittent    Equipment Recommendations  3 in 1 bedside commode    Recommendations for Other Services      Precautions / Restrictions Precautions Precautions: Fall Precaution Comments: Abdominal surgery Restrictions Weight Bearing Restrictions: No       Mobility Bed Mobility Overal bed mobility: Modified Independent Bed Mobility: Sit to Sidelying;Rolling Rolling: Supervision       Sit to sidelying: Supervision General bed mobility comments: supervision for log rolling technique, no cues required  Transfers Overall transfer level: Needs assistance(supervisionA) Equipment used: Rolling walker (2 wheeled) Transfers: Sit to/from Stand Sit to Stand: Supervision         General transfer comment: no verbal cues required for safety.    Balance Overall balance assessment: Needs assistance Sitting-balance support: Feet supported Sitting balance-Leahy Scale: Good     Standing balance support: Bilateral upper extremity supported Standing balance-Leahy Scale: Fair                             ADL either performed or assessed with clinical judgement   ADL Overall ADL's : Needs assistance/impaired                 Upper Body Dressing : Supervision/safety   Lower Body Dressing: Supervision/safety               Functional mobility during ADLs: Rolling walker;Modified independent;Supervision/safety General ADL Comments: pt mostly limited due to pain     Vision       Perception     Praxis      Cognition Arousal/Alertness: Awake/alert Behavior During Therapy: Flat affect;WFL for tasks assessed/performed Overall Cognitive Status: Within Functional Limits for tasks assessed                                          Exercises     Shoulder Instructions       General Comments      Pertinent Vitals/ Pain       Pain Assessment: No/denies pain  Home Living                                          Prior Functioning/Environment              Frequency  Min 2X/week        Progress Toward  Goals  OT Goals(current goals can now be found in the care plan section)     Acute Rehab OT Goals Patient Stated Goal: less pain OT Goal Formulation: With patient Time For Goal Achievement: 05/12/18 Potential to Achieve Goals: Good ADL Goals Pt Will Perform Grooming: with modified independence;standing Pt Will Perform Lower Body Bathing: with modified independence;sit to/from stand;with adaptive equipment Pt Will Perform Upper Body Dressing: with modified independence;sitting Pt Will Perform Lower Body Dressing: with modified independence;sit to/from stand Pt Will Transfer to Toilet: with modified independence;ambulating;bedside commode Pt Will Perform Toileting - Clothing Manipulation and hygiene: with modified independence;sit to/from stand  Plan      Co-evaluation                  AM-PAC OT "6 Clicks" Daily Activity     Outcome Measure   Help from another person eating meals?: None Help from another person taking care of personal grooming?: None Help from another person toileting, which includes using toliet, bedpan, or urinal?: A Little Help from another person bathing (including washing, rinsing, drying)?: A Little Help from another person to put on and taking off regular upper body clothing?: None Help from another person to put on and taking off regular lower body clothing?: A Little 6 Click Score: 21    End of Session Equipment Utilized During Treatment: Gait belt;Rolling walker  OT Visit Diagnosis: Other abnormalities of gait and mobility (R26.89);Muscle weakness (generalized) (M62.81) Pain - part of body: (abdomen)   Activity Tolerance Patient tolerated treatment well   Patient Left in bed;with call bell/phone within reach;with bed alarm set   Nurse Communication Mobility status        Time: 7510-2585 OT Time Calculation (min): 19 min  Charges: OT Treatments $Therapeutic Activity: 8-22 mins  Darryl Nestle) Marsa Aris OTR/L Acute Rehabilitation Services Pager: [phone] Office: [phone]     Fredda Hammed 05/06/2018, 10:42 AM

## 2018-05-07 LAB — CBC
HCT: 25.3 % — ABNORMAL LOW (ref 39.0–52.0)
HEMOGLOBIN: 8.6 g/dL — AB (ref 13.0–17.0)
MCH: 32 pg (ref 26.0–34.0)
MCHC: 34 g/dL (ref 30.0–36.0)
MCV: 94.1 fL (ref 80.0–100.0)
Platelets: 858 10*3/uL — ABNORMAL HIGH (ref 150–400)
RBC: 2.69 MIL/uL — ABNORMAL LOW (ref 4.22–5.81)
RDW: 15.2 % (ref 11.5–15.5)
WBC: 7 10*3/uL (ref 4.0–10.5)
nRBC: 0 % (ref 0.0–0.2)

## 2018-05-07 LAB — COMPREHENSIVE METABOLIC PANEL
ALT: 97 U/L — ABNORMAL HIGH (ref 0–44)
AST: 80 U/L — ABNORMAL HIGH (ref 15–41)
Albumin: 2.5 g/dL — ABNORMAL LOW (ref 3.5–5.0)
Alkaline Phosphatase: 78 U/L (ref 38–126)
Anion gap: 8 (ref 5–15)
BUN: 13 mg/dL (ref 8–23)
CALCIUM: 8.5 mg/dL — AB (ref 8.9–10.3)
CO2: 29 mmol/L (ref 22–32)
Chloride: 98 mmol/L (ref 98–111)
Creatinine, Ser: 0.55 mg/dL — ABNORMAL LOW (ref 0.61–1.24)
GFR calc Af Amer: >60 mL/min (ref >60)
GFR calc non Af Amer: >60 mL/min (ref >60)
Glucose, Bld: 146 mg/dL — ABNORMAL HIGH (ref 70–99)
Potassium: 4.4 mmol/L (ref 3.5–5.1)
Sodium: 135 mmol/L (ref 135–145)
Total Bilirubin: 0.4 mg/dL (ref 0.3–1.2)
Total Protein: 5.6 g/dL — ABNORMAL LOW (ref 6.5–8.1)

## 2018-05-07 LAB — GLUCOSE, CAPILLARY
GLUCOSE-CAPILLARY: 168 mg/dL — AB (ref 70–99)
Glucose-Capillary: 116 mg/dL — ABNORMAL HIGH (ref 70–99)
Glucose-Capillary: 140 mg/dL — ABNORMAL HIGH (ref 70–99)
Glucose-Capillary: 162 mg/dL — ABNORMAL HIGH (ref 70–99)
Glucose-Capillary: 180 mg/dL — ABNORMAL HIGH (ref 70–99)
Glucose-Capillary: 182 mg/dL — ABNORMAL HIGH (ref 70–99)

## 2018-05-07 LAB — PREALBUMIN: Prealbumin: 16.3 mg/dL — ABNORMAL LOW (ref 18–38)

## 2018-05-07 NOTE — Plan of Care (Signed)
  Problem: Health Behavior/Discharge Planning: Goal: Ability to manage health-related needs will improve Outcome: Progressing   Problem: Clinical Measurements: Goal: Diagnostic test results will improve Outcome: Progressing   

## 2018-05-07 NOTE — Progress Notes (Addendum)
15 Days Post-Op  Subjective: Doing well with full liquid diet. Remains also  on tube feeds, vital AF 1.2 at 55 cc/h which meets 100% of estimated goals [place] has arranged home health services through [facility] [place].  They are going to deliver tube feeds and pump to the patient's home. They have also ordered a walker and 3 in 1 commode  Case management has arranged all of this which is very helpful    Objective: Vital signs in last 24 hours: Temp:  [98.2 F (36.8 C)-98.9 F (37.2 C)] 98.9 F (37.2 C) (12/25 0406) Pulse Rate:  [72-78] 72 (12/25 0406) Resp:  [16-17] 16 (12/25 0406) BP: (112-122)/(71-76) 119/76 (12/25 0406) SpO2:  [99 %-100 %] 99 % (12/25 0406) Last BM Date: 05/05/18  Intake/Output from previous day: 12/24 0701 - 12/25 0700 In: 1048.9 [P.O.:657; I.V.:391.9] Out: 2952 [Urine:1425; Drains:20] Intake/Output this shift: No intake/output data recorded.    PE: Gen: Alert, NAD, pleasant, moderate muscle wasting HEENT: EOM's intact, pupils equal and round Card: RRR Pulm: CTAB, no W/R/R, effort normal, WUX:LKGMWNU incision cdi with staples intact, soft, scaphoid , NT/ND, +BS,J tube site clean; drainage purulent.  20 cc out in 24 hours Skin: no rashes noted, warm and dry   Lab Results:  Results for orders placed or performed during the hospital encounter of 04/22/18 (from the past 24 hour(s))  Glucose, capillary     Status: Abnormal   Collection Time: 05/06/18 11:56 AM  Result Value Ref Range   Glucose-Capillary 182 (H) 70 - 99 mg/dL   Comment 1 Notify RN    Comment 2 Document in Chart   Glucose, capillary     Status: Abnormal   Collection Time: 05/06/18  5:20 PM  Result Value Ref Range   Glucose-Capillary 127 (H) 70 - 99 mg/dL   Comment 1 Notify RN    Comment 2 Document in Chart   Glucose, capillary     Status: Abnormal   Collection Time: 05/06/18  8:04 PM  Result Value Ref Range   Glucose-Capillary 214 (H) 70 - 99 mg/dL   Glucose, capillary     Status: Abnormal   Collection Time: 05/06/18 11:59 PM  Result Value Ref Range   Glucose-Capillary 116 (H) 70 - 99 mg/dL  Glucose, capillary     Status: Abnormal   Collection Time: 05/07/18  4:03 AM  Result Value Ref Range   Glucose-Capillary 182 (H) 70 - 99 mg/dL  Glucose, capillary     Status: Abnormal   Collection Time: 05/07/18  7:45 AM  Result Value Ref Range   Glucose-Capillary 168 (H) 70 - 99 mg/dL     Studies/Results: No results found.  . enoxaparin (LOVENOX) injection  40 mg Subcutaneous Q24H  . insulin aspart  0-9 Units Subcutaneous Q4H  . zolpidem  5 mg Oral QHS     Assessment/Plan: s/p Procedure(s): LAPAROSCOPY DIAGNOSTIC ERAS PATHWAY TOTAL GASTRECTOMY DISTAL PANCREATECTOMY SPLENECTOMY INSERTION OF JEJUNOSTOMY TUBE  CXr with pneumonia,antibiotics for pneumonia: 5-day course antibiotics completed  - -oxy liquid and prn dilaudid -PT/OT eval rec intermittent supervision with [place]  Send drain for lipase(result still pending lab contacted to complete.). Will not pull   Advance to DYS 2 diet We will ask nutritional services to begin formulating a postgastrectomy diet for him in hopes that we can slowly transition off of tube feedings . ABL anemia -  ID -vancomycin/zosyn 12/18>>12/23--dc FEN -,TF at goal 55 ml/hr;NG tube removed 12/23.  Advance to DYS 2 today  VTE -scds, lovenox Foley -none Follow up -Dr. Barry Dienes Disposition-home to [place].  Case management working on home health services  Plan: Dysphagia 2 diet Slowly wean tube feeds as outpatient Check labs today Probable discharge home tomorrow with home health agency support as detailed above   @PROBHOSP @  LOS: 15 days    Adin Hector 05/07/2018  . .prob

## 2018-05-08 LAB — GLUCOSE, CAPILLARY
Glucose-Capillary: 159 mg/dL — ABNORMAL HIGH (ref 70–99)
Glucose-Capillary: 161 mg/dL — ABNORMAL HIGH (ref 70–99)
Glucose-Capillary: 168 mg/dL — ABNORMAL HIGH (ref 70–99)
Glucose-Capillary: 183 mg/dL — ABNORMAL HIGH (ref 70–99)

## 2018-05-08 MED ORDER — HYDROMORPHONE HCL 1 MG/ML IJ SOLN
1.0000 mg | Freq: Four times a day (QID) | INTRAMUSCULAR | Status: DC | PRN
  Administered 2018-05-08: 1 mg via INTRAVENOUS
  Filled 2018-05-08: qty 1

## 2018-05-08 MED ORDER — OXYCODONE HCL 5 MG PO TABS
5.0000 mg | ORAL_TABLET | ORAL | Status: DC | PRN
  Administered 2018-05-08: 10 mg via ORAL
  Filled 2018-05-08: qty 2

## 2018-05-08 MED ORDER — OXYCODONE HCL 5 MG PO TABS: 5.0000 mg | ORAL_TABLET | ORAL | 0 refills | Status: AC | PRN

## 2018-05-08 MED ORDER — ACETAMINOPHEN 325 MG PO TABS: 650.0000 mg | ORAL_TABLET | Freq: Four times a day (QID) | ORAL | Status: AC

## 2018-05-08 MED ORDER — ACETAMINOPHEN 325 MG PO TABS
650.0000 mg | ORAL_TABLET | Freq: Four times a day (QID) | ORAL | Status: DC
  Administered 2018-05-08: 650 mg via ORAL
  Filled 2018-05-08: qty 2

## 2018-05-08 NOTE — Discharge Summary (Signed)
[facility] Discharge Summary   Patient ID: Seanpatrick Maisano MRN: [id number] DOB/AGE: 07-06-1950 67 y.o.  Admit date: 04/22/2018 Discharge date: 05/08/2018  Discharge Diagnosis Patient Active Problem List   Diagnosis Date Noted  . Protein-calorie malnutrition, severe 04/24/2018  . S/P total gastrectomy and Roux-en-Y esophagojejunal anastomosis 04/22/2018  . Hypertension   . Diabetes mellitus ([place])   . Gastric adenocarcinoma ([place]) 07/05/2017  . 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 Degeneration of lumbar or lumbosacral intervertebral disc 03/13/2004  . Hematochezia 01/04/2004   Procedures Dr. Stark Klein (04/22/2018) - LAPAROSCOPY DIAGNOSTIC ERAS PATHWAY (N/A) TOTAL GASTRECTOMY (N/A) DISTAL PANCREATECTOMY (N/A) SPLENECTOMY (N/A)  INSERTION OF JEJUNOSTOMY TUBE (Left)   SURGICAL PATHOLOGY - ypT4b, ypN3a 1. Liver, biopsy - BILE DUCT HAMARTOMA. - NO CARCINOMA IDENTIFIED. 2. Stomach, resection for tumor, and distal pancreas and spleen - ADENOCARCINOMA OF STOMACH, STATUS POST NEOADJUVANT TREATEMENT. - TUMOR INVADES ADJACENT STRUCTURES/ORGANS (DISTAL PANCREAS). - MARGINS OF RESECTION ARE NOT INVOLVED. 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  Hospital Course:  Mr. Haapala is a 67 year old male with a past medical history of hypertension and diabetes admitted for surgical management of gastric adenocarcinoma status post neoadjuvant chemoradiation.  He underwent the above procedure by Dr. Barry Dienes which he tolerated well.   Postoperatively Foley and NG tube remained in place.  POD#1 the patient was given a unit of PRBCs given his increase in creatinine and marginal urine output. Urine output increased and Foley was removed.  Tube feeds were gradually advanced to goal.  Upper GI was negative for anastomotic leak and the patient's diet was gradually advanced.  He did have some bilious vomiting postoperatively, but this resolved with time and antiemetics.   He did develop pneumonia which was treated with 5 days of antibiotics.  On 05/08/2018 the patient's vitals were stable, pain controlled, tolerating tube feeds at goal and also taking in some p.o., mobilizing with therapies, having bowel function, and medically stable for discharge home.  Blake drain was left in place given drain lipase level of over 5,000.  Staples were removed prior to discharge.  Follow-up with Dr. Barry Dienes is being arranged in 1 to 2 weeks.  Patient knows to call with questions or concerns.  Physical Exam: General:  Alert, NAD, chronically ill-appearing elderly male  Abd:  Soft, thin, appropriately tender, midline incision clean and dry, R blake drain with small amt purulent drainage, J-tube secure, no surrounding erythema, TF running at goal   Allergies as of 05/08/2018   No Known Allergies     Medication List    TAKE these medications   acetaminophen 325 MG tablet Commonly known as:  TYLENOL Take 2 tablets (650 mg total) by mouth every 6 (six) hours. What changed:    medication strength  how much to take  when to take this  reasons to take this   aspirin EC 81 MG tablet Take 81 mg by mouth daily.   ENSURE PLUS Liqd Take 237 mLs by mouth 3 (three) times daily as needed (nutritional supplement).   glucose blood test strip 1 each by Other route once a week. PRECISION XCEED PRO   lisinopril 10 MG tablet Commonly known as:  PRINIVIL,ZESTRIL Take 10 mg by mouth daily.   metFORMIN 500 MG 24 hr tablet Commonly known as:  GLUCOPHAGE-XR Take 500 mg by mouth daily with supper.   oxyCODONE 5 MG immediate release tablet Commonly known as:  Oxy IR/ROXICODONE Take 1-2 tablets (5-10 mg total) by mouth  every 4 (four) hours as needed for up to 7 days for moderate pain or severe pain.   terazosin 2 MG capsule Commonly known as:  HYTRIN Take 2 mg by mouth at bedtime.   VITAMIN D PO Take 1 tablet by mouth daily.            Durable Medical Equipment  (From  admission, onward)         Start     Ordered   04/29/18 1045  For home use only DME Tube feeding pump  Once     04/29/18 1044   04/29/18 1042  For home use only DME Tube feeding  Once    Comments:  Severe malnutrition in context of chronic illness  INTERVENTION:   -Continue Vital AF 1.2 ( or equivalent ) @ 55 ml/hr via j-tube  Tube feeding regimen provides 1584 kcal (100% of needs), 99 grams of protein, and 1070 ml of H2O.     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  Ongoing  GOAL:   Patient will meet greater than or equal to 90% of their needs  Met with TF  ASSESSMENT:   67 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  The patient presented to the [facility] in [place] in February 2019 with dysphasia.  Work-up showed a 9 cm mass at the GE junction.  12/10- s/p TOTAL GASTRECTOMY ; DISTAL PANCREATECTOMY; SPLENECTOMY; INSERTION OF JEJUNOSTOMY TUBE  Patient will need tube feeds greater than 90 days.   04/29/18 1044   04/29/18 1023  For home use only DME 3 n 1  Once     04/29/18 1022   04/29/18 1022  For home use only DME Walker rolling  Once    Question:  Patient needs a walker to treat with the following condition  Answer:  S/P total gastrectomy and Roux-en-Y esophagojejunal anastomosis   04/29/18 1022           Follow-up Information    [name], FNP. Schedule an appointment as soon as possible for a visit.   Specialty:  Nurse Practitioner Contact information: [address] [place] [address] [phone]        [place] Follow up.   Why:  Provide home health services Contact information: phone [phone]        Stark Klein, MD Follow up.   Specialty:  General Surgery Why:  our office is scheduling you for a follow up appointment. call to confirm appointment date/time. Contact  information: [address] [place] [address] [phone]           Signed: Obie Dredge, [facility] 05/08/2018, 9:27 AM

## 2018-05-08 NOTE — Care Management (Signed)
Case manager faxed discharge summary and orders to [facility]: [phone].

## 2018-05-09 NOTE — Care Management (Signed)
Faxed discharge summary to [place] , nurse for Robert Bellow NP at [facility] [phone]

## 2018-06-12 ENCOUNTER — Other Ambulatory Visit: Payer: Self-pay | Admitting: Oncology

## 2018-06-12 ENCOUNTER — Other Ambulatory Visit (HOSPITAL_COMMUNITY): Payer: Self-pay | Admitting: Oncology

## 2018-06-12 DIAGNOSIS — C168 Malignant neoplasm of overlapping sites of stomach: Secondary | ICD-10-CM

## 2018-06-20 ENCOUNTER — Encounter (HOSPITAL_COMMUNITY): Payer: Self-pay

## 2018-06-20 ENCOUNTER — Encounter (HOSPITAL_COMMUNITY): Admission: RE | Admit: 2018-06-20 | Payer: Non-veteran care | Source: Ambulatory Visit

## 2018-07-15 ENCOUNTER — Ambulatory Visit (HOSPITAL_COMMUNITY): Admission: RE | Admit: 2018-07-15 | Payer: Non-veteran care | Source: Ambulatory Visit

## 2018-07-24 ENCOUNTER — Other Ambulatory Visit (HOSPITAL_COMMUNITY): Payer: Self-pay | Admitting: Oncology

## 2018-07-24 DIAGNOSIS — C159 Malignant neoplasm of esophagus, unspecified: Secondary | ICD-10-CM

## 2018-07-24 DIAGNOSIS — C169 Malignant neoplasm of stomach, unspecified: Secondary | ICD-10-CM

## 2018-07-25 ENCOUNTER — Ambulatory Visit (HOSPITAL_COMMUNITY): Payer: Non-veteran care

## 2018-07-28 ENCOUNTER — Encounter (HOSPITAL_COMMUNITY): Payer: Non-veteran care

## 2018-07-28 ENCOUNTER — Other Ambulatory Visit: Payer: Self-pay

## 2018-07-28 ENCOUNTER — Ambulatory Visit (HOSPITAL_COMMUNITY)
Admission: RE | Admit: 2018-07-28 | Discharge: 2018-07-28 | Disposition: A | Discharge status: Home / Self Care | Payer: No Typology Code available for payment source | Source: Ambulatory Visit | Attending: Oncology | Admitting: Oncology

## 2018-07-28 DIAGNOSIS — C159 Malignant neoplasm of esophagus, unspecified: Secondary | ICD-10-CM | POA: Insufficient documentation

## 2018-07-28 DIAGNOSIS — C169 Malignant neoplasm of stomach, unspecified: Secondary | ICD-10-CM | POA: Insufficient documentation

## 2018-07-28 MED ORDER — FLUDEOXYGLUCOSE F - 18 (FDG) INJECTION
6.0300 | Freq: Once | INTRAVENOUS | Status: AC | PRN
  Administered 2018-07-28: 6.03 via INTRAVENOUS

## 2018-09-12 DEATH — deceased

## 2019-01-07 IMAGING — DX DG ABDOMEN 1V
1 series · 1 of 1 positions shown · non-contrast
Comparison: CT Abdomen and Pelvis 3931 hours today.

CLINICAL DATA: 67-year-old male with enteric tube placement. Status
post total gastrectomy with Roux-en-Y esophagojejunal anastomosis.

EXAM:
ABDOMEN - 1 VIEW

[abdomen kub]
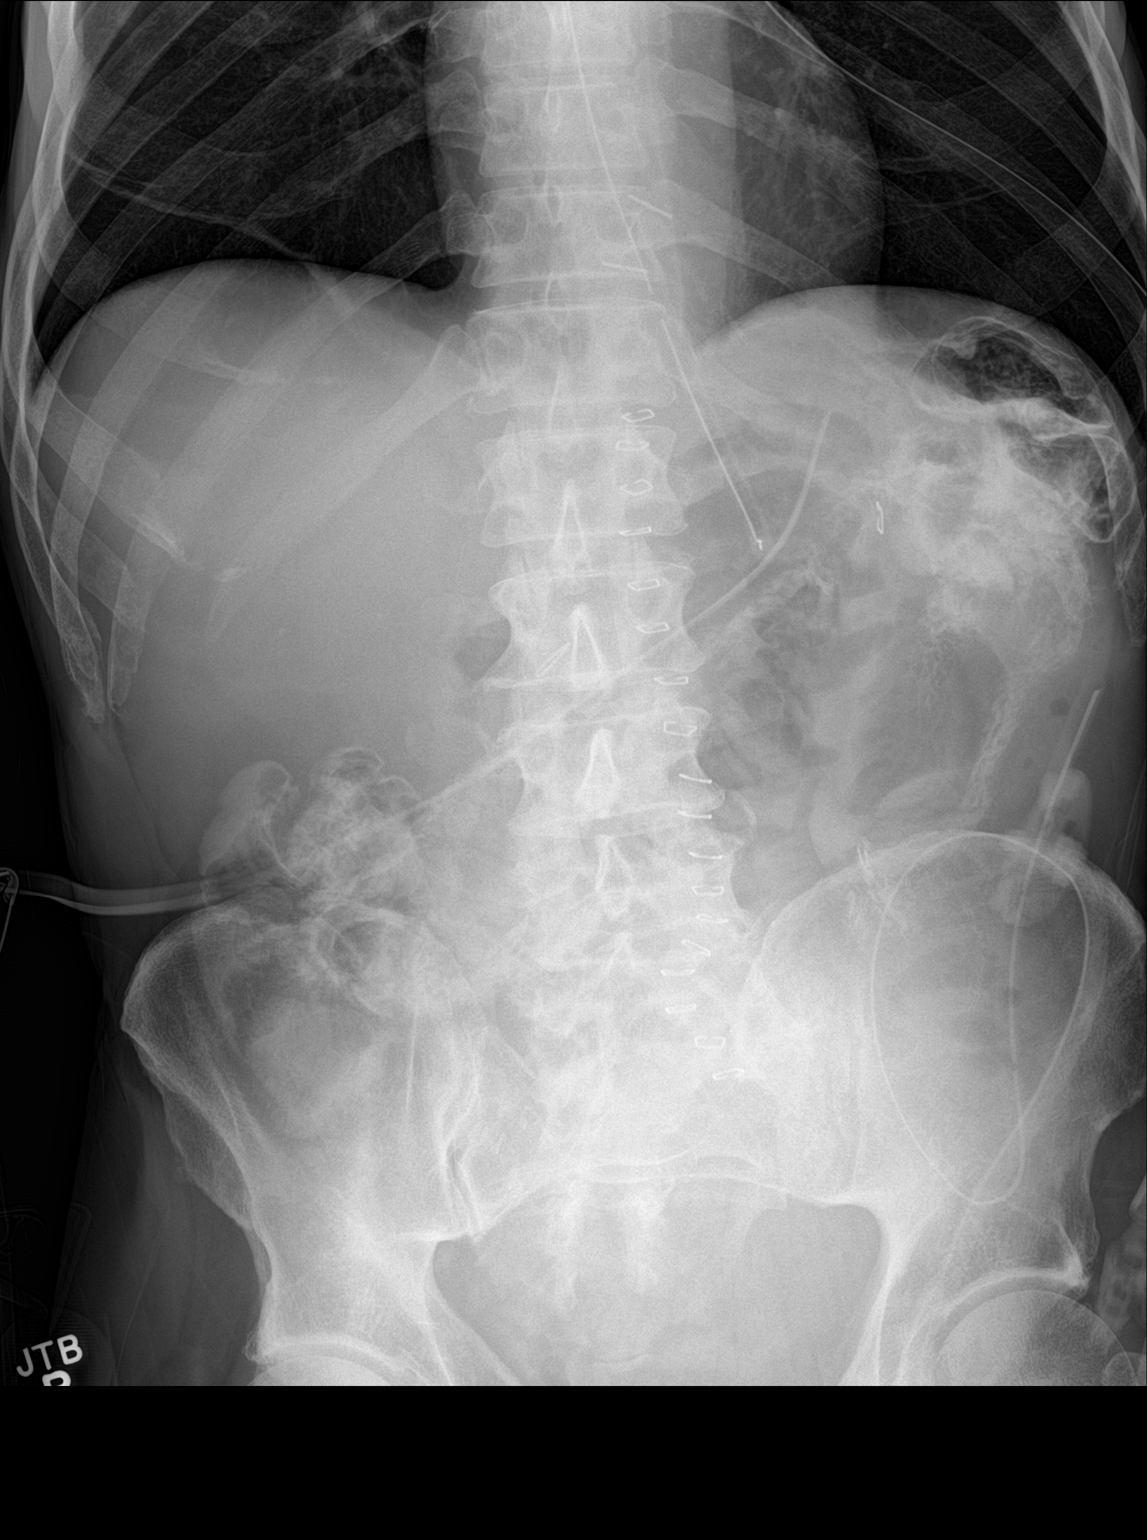

[1 of 1 positions shown; findings below may reference images not displayed]

FINDINGS: Portable AP supine view at 4288 hours. Enteric tube courses through
the esophagus and terminates in the left upper quadrant. When
comparing with the CT earlier today the tip is at the level of the
anastomosed proximal small bowel loop. Side hole is at the level of
the distal esophagus.

Negative lung bases. Residual oral contrast in small and large bowel
loops plus 2 postoperative drains. Midline abdominal skin staples.
No acute osseous abnormality identified.
IMPRESSION: Status post total gastrectomy with Roux-en-Y esophagojejunal
anastomosis.

Enteric tube placed with tip at the level of the anastomosed
proximal small bowel loop (see CT Abdomen and Pelvis earlier today)
and side hole at the level of the distal esophagus.
